# Patient Record
Sex: Female | Born: 1950 | Race: Black or African American | Hispanic: No | Marital: Married | Smoking: Never smoker
Health system: Southern US, Community
[De-identification: ages and names within clinical notes are randomized; demographics above are authoritative.]

## PROBLEM LIST (undated history)

## (undated) DIAGNOSIS — M1990 Unspecified osteoarthritis, unspecified site: Secondary | ICD-10-CM

## (undated) DIAGNOSIS — E785 Hyperlipidemia, unspecified: Secondary | ICD-10-CM

## (undated) DIAGNOSIS — I10 Essential (primary) hypertension: Secondary | ICD-10-CM

## (undated) DIAGNOSIS — R112 Nausea with vomiting, unspecified: Secondary | ICD-10-CM

## (undated) DIAGNOSIS — K219 Gastro-esophageal reflux disease without esophagitis: Secondary | ICD-10-CM

## (undated) DIAGNOSIS — Z9889 Other specified postprocedural states: Secondary | ICD-10-CM

## (undated) HISTORY — PX: BREAST SURGERY: SHX581

## (undated) HISTORY — PX: COLONOSCOPY: SHX174

---

## 2003-02-07 HISTORY — PX: OTHER SURGICAL HISTORY: SHX169

## 2011-02-07 HISTORY — PX: OTHER SURGICAL HISTORY: SHX169

## 2013-05-26 ENCOUNTER — Emergency Department (HOSPITAL_BASED_OUTPATIENT_CLINIC_OR_DEPARTMENT_OTHER)
Admission: EM | Admit: 2013-05-26 | Discharge: 2013-05-26 | Disposition: A | Discharge status: Home / Self Care | Payer: BC Managed Care – PPO | Attending: Emergency Medicine | Admitting: Emergency Medicine

## 2013-05-26 ENCOUNTER — Encounter (HOSPITAL_BASED_OUTPATIENT_CLINIC_OR_DEPARTMENT_OTHER): Payer: Self-pay | Admitting: Emergency Medicine

## 2013-05-26 DIAGNOSIS — E785 Hyperlipidemia, unspecified: Secondary | ICD-10-CM | POA: Insufficient documentation

## 2013-05-26 DIAGNOSIS — M545 Low back pain, unspecified: Secondary | ICD-10-CM

## 2013-05-26 DIAGNOSIS — Y9229 Other specified public building as the place of occurrence of the external cause: Secondary | ICD-10-CM | POA: Insufficient documentation

## 2013-05-26 DIAGNOSIS — W19XXXA Unspecified fall, initial encounter: Secondary | ICD-10-CM

## 2013-05-26 DIAGNOSIS — IMO0002 Reserved for concepts with insufficient information to code with codable children: Secondary | ICD-10-CM | POA: Insufficient documentation

## 2013-05-26 DIAGNOSIS — Z791 Long term (current) use of non-steroidal anti-inflammatories (NSAID): Secondary | ICD-10-CM | POA: Insufficient documentation

## 2013-05-26 DIAGNOSIS — W108XXA Fall (on) (from) other stairs and steps, initial encounter: Secondary | ICD-10-CM | POA: Insufficient documentation

## 2013-05-26 DIAGNOSIS — Y9301 Activity, walking, marching and hiking: Secondary | ICD-10-CM | POA: Insufficient documentation

## 2013-05-26 DIAGNOSIS — S5000XA Contusion of unspecified elbow, initial encounter: Secondary | ICD-10-CM | POA: Insufficient documentation

## 2013-05-26 DIAGNOSIS — Z79899 Other long term (current) drug therapy: Secondary | ICD-10-CM | POA: Insufficient documentation

## 2013-05-26 DIAGNOSIS — I10 Essential (primary) hypertension: Secondary | ICD-10-CM | POA: Insufficient documentation

## 2013-05-26 HISTORY — DX: Hyperlipidemia, unspecified: E78.5

## 2013-05-26 HISTORY — DX: Essential (primary) hypertension: I10

## 2013-05-26 MED ORDER — CYCLOBENZAPRINE HCL 10 MG PO TABS: 10.0000 mg | ORAL_TABLET | Freq: Three times a day (TID) | ORAL | Status: DC | PRN

## 2013-05-26 NOTE — Discharge Instructions (Signed)
Back Pain, Adult Low back pain is very common. About 1 in 5 people have back pain.The cause of low back pain is rarely dangerous. The pain often gets better over time.About half of people with a sudden onset of back pain feel better in just 2 weeks. About 8 in 10 people feel better by 6 weeks.  CAUSES Some common causes of back pain include:  Strain of the muscles or ligaments supporting the spine.  Wear and tear (degeneration) of the spinal discs.  Arthritis.  Direct injury to the back. DIAGNOSIS Most of the time, the direct cause of low back pain is not known.However, back pain can be treated effectively even when the exact cause of the pain is unknown.Answering your caregiver's questions about your overall health and symptoms is one of the most accurate ways to make sure the cause of your pain is not dangerous. If your caregiver needs more information, he or she may order lab work or imaging tests (X-rays or MRIs).However, even if imaging tests show changes in your back, this usually does not require surgery. HOME CARE INSTRUCTIONS For many people, back pain returns.Since low back pain is rarely dangerous, it is often a condition that people can learn to manageon their own.   Remain active. It is stressful on the back to sit or stand in one place. Do not sit, drive, or stand in one place for more than 30 minutes at a time. Take short walks on level surfaces as soon as pain allows.Try to increase the length of time you walk each day.  Do not stay in bed.Resting more than 1 or 2 days can delay your recovery.  Do not avoid exercise or work.Your body is made to move.It is not dangerous to be active, even though your back may hurt.Your back will likely heal faster if you return to being active before your pain is gone.  Pay attention to your body when you bend and lift. Many people have less discomfortwhen lifting if they bend their knees, keep the load close to their bodies,and  avoid twisting. Often, the most comfortable positions are those that put less stress on your recovering back.  Find a comfortable position to sleep. Use a firm mattress and lie on your side with your knees slightly bent. If you lie on your back, put a pillow under your knees.  Only take over-the-counter or prescription medicines as directed by your caregiver. Over-the-counter medicines to reduce pain and inflammation are often the most helpful.Your caregiver may prescribe muscle relaxant drugs.These medicines help dull your pain so you can more quickly return to your normal activities and healthy exercise.  Put ice on the injured area.  Put ice in a plastic bag.  Place a towel between your skin and the bag.  Leave the ice on for 15-20 minutes, 03-04 times a day for the first 2 to 3 days. After that, ice and heat may be alternated to reduce pain and spasms.  Ask your caregiver about trying back exercises and gentle massage. This may be of some benefit.  Avoid feeling anxious or stressed.Stress increases muscle tension and can worsen back pain.It is important to recognize when you are anxious or stressed and learn ways to manage it.Exercise is a great option. SEEK MEDICAL CARE IF:  You have pain that is not relieved with rest or medicine.  You have pain that does not improve in 1 week.  You have new symptoms.  You are generally not feeling well. SEEK   IMMEDIATE MEDICAL CARE IF:   You have pain that radiates from your back into your legs.  You develop new bowel or bladder control problems.  You have unusual weakness or numbness in your arms or legs.  You develop nausea or vomiting.  You develop abdominal pain.  You feel faint. Document Released: 01/23/2005 Document Revised: 07/25/2011 Document Reviewed: 06/13/2010 Exit[employer] Patient Information 2014 [employer].  

## 2013-05-26 NOTE — ED Provider Notes (Signed)
CSN: [id number]     Arrival date & time 05/26/13  [id number] History   First MD Initiated Contact with Patient 05/26/13 1005     Chief Complaint  Patient presents with  . Fall     (Consider location/radiation/quality/duration/timing/severity/associated sxs/prior Treatment) Patient is a 63 y.o. female presenting with fall.  Fall   Pt reports she stumbled while walking down carpeted stairs at church yesterday, falling down about 8 steps. She reports abrasions to knees, bruise to L elbow and pain in R lower back since then. The back pain is worsening and the main reason for coming to the ED. She describes it as an aching pain 'in hip' but points to her R lumbar paraspinal area. She is able to walk without difficulty but bending and stepping up into truck were painful this morning. Denies head injury or LOC.   Past Medical History  Diagnosis Date  . Hypertension   . Hyperlipemia    History reviewed. No pertinent past surgical history. No family history on file. History  Substance Use Topics  . Smoking status: Never Smoker   . Smokeless tobacco: Not on file  . Alcohol Use: No   OB History   Grav Para Term Preterm Abortions TAB SAB Ect Mult Living                 Review of Systems All other systems reviewed and are negative except as noted in HPI.     Allergies  Asa and Codeine  Home Medications   Prior to Admission medications   Medication Sig Start Date End Date Taking? Authorizing Provider  amLODipine (NORVASC) 10 MG tablet Take 10 mg by mouth daily.   Yes Historical Provider, MD  losartan-hydrochlorothiazide (HYZAAR) 100-25 MG per tablet Take 1 tablet by mouth daily.   Yes Historical Provider, MD  meloxicam (MOBIC) 15 MG tablet Take 7.5 mg by mouth 2 (two) times daily.   Yes Historical Provider, MD  metoprolol (LOPRESSOR) 100 MG tablet Take 100 mg by mouth 2 (two) times daily.   Yes Historical Provider, MD  rosuvastatin (CRESTOR) 40 MG tablet Take 40 mg by mouth daily.   Yes  Historical Provider, MD   BP 203/104  Pulse 75  Temp(Src) 97.8 F (36.6 C) (Oral)  Resp 18  Ht 5\' 5"  (1.651 m)  Wt 164 lb (74.39 kg)  BMI 27.29 kg/m2  SpO2 100% Physical Exam  Nursing note and vitals reviewed. Constitutional: She is oriented to person, place, and time. She appears well-developed and well-nourished.  HENT:  Head: Normocephalic and atraumatic.  Eyes: EOM are normal. Pupils are equal, round, and reactive to light.  Neck: Normal range of motion. Neck supple.  Cardiovascular: Normal rate, normal heart sounds and intact distal pulses.   Pulmonary/Chest: Effort normal and breath sounds normal.  Abdominal: Bowel sounds are normal. She exhibits no distension. There is no tenderness.  Musculoskeletal: Normal range of motion. She exhibits tenderness. She exhibits no edema.  Small contusion to medial L elbow, but FROM no, effusion, no bony tenderness; superficial abrasion both knees; moderate tenderness R lumbar paraspinal muscles, no bony tenderness in L-S spine or in R hip which has FROM  Neurological: She is alert and oriented to person, place, and time. She has normal strength. No cranial nerve deficit or sensory deficit.  Skin: Skin is warm and dry. No rash noted.  Psychiatric: She has a normal mood and affect.    ED Course  Procedures (including critical care time) Labs Review Labs  Reviewed - No data to display  Imaging Review No results found.   EKG Interpretation None      MDM   Final diagnoses:  Low back pain  Fall    Doubt any bony injury. She is taking Mobic for arthritis at home, does not do well with narcotic medications and has had good relief in the past with Flexeril. Advised not to drive or operate heavy machinery. PCP followup if not improving.     Yanelie Abraha B. Bernette MayersSheldon, MD 05/26/13 1018

## 2013-05-26 NOTE — ED Notes (Signed)
Pt fell yesterday,tripped on dress shoes and went down approx 8 steps. Pt hurting to right hip and left upper arm. Ambulatory on assessment.

## 2013-07-30 ENCOUNTER — Other Ambulatory Visit (HOSPITAL_COMMUNITY): Payer: Self-pay | Admitting: Orthopaedic Surgery

## 2013-07-31 ENCOUNTER — Other Ambulatory Visit (HOSPITAL_COMMUNITY): Payer: Self-pay | Admitting: Orthopaedic Surgery

## 2013-08-07 ENCOUNTER — Encounter (HOSPITAL_COMMUNITY): Payer: Self-pay | Admitting: Pharmacy Technician

## 2013-08-12 ENCOUNTER — Ambulatory Visit (HOSPITAL_COMMUNITY)
Admission: RE | Admit: 2013-08-12 | Discharge: 2013-08-12 | Disposition: A | Discharge status: Home / Self Care | Payer: BC Managed Care – PPO | Source: Ambulatory Visit | Attending: Orthopaedic Surgery | Admitting: Orthopaedic Surgery

## 2013-08-12 ENCOUNTER — Encounter (HOSPITAL_COMMUNITY): Payer: Self-pay

## 2013-08-12 ENCOUNTER — Encounter (HOSPITAL_COMMUNITY)
Admission: RE | Admit: 2013-08-12 | Discharge: 2013-08-12 | Disposition: A | Discharge status: Home / Self Care | Payer: BC Managed Care – PPO | Source: Ambulatory Visit | Attending: Orthopaedic Surgery | Admitting: Orthopaedic Surgery

## 2013-08-12 DIAGNOSIS — Q762 Congenital spondylolisthesis: Secondary | ICD-10-CM | POA: Insufficient documentation

## 2013-08-12 HISTORY — DX: Nausea with vomiting, unspecified: R11.2

## 2013-08-12 HISTORY — DX: Unspecified osteoarthritis, unspecified site: M19.90

## 2013-08-12 HISTORY — DX: Other specified postprocedural states: Z98.890

## 2013-08-12 HISTORY — DX: Gastro-esophageal reflux disease without esophagitis: K21.9

## 2013-08-12 LAB — CBC
HCT: 38.8 % (ref 36.0–46.0)
HEMOGLOBIN: 13 g/dL (ref 12.0–15.0)
MCH: 29.1 pg (ref 26.0–34.0)
MCHC: 33.5 g/dL (ref 30.0–36.0)
MCV: 87 fL (ref 78.0–100.0)
Platelets: 232 10*3/uL (ref 150–400)
RBC: 4.46 MIL/uL (ref 3.87–5.11)
RDW: 14 % (ref 11.5–15.5)
WBC: 7.7 10*3/uL (ref 4.0–10.5)

## 2013-08-12 LAB — URINE MICROSCOPIC-ADD ON

## 2013-08-12 LAB — PROTIME-INR
INR: 1.05 (ref 0.00–1.49)
Prothrombin Time: 13.7 seconds (ref 11.6–15.2)

## 2013-08-12 LAB — COMPREHENSIVE METABOLIC PANEL
ALBUMIN: 4 g/dL (ref 3.5–5.2)
ALK PHOS: 74 U/L (ref 39–117)
ALT: 16 U/L (ref 0–35)
ANION GAP: 16 — AB (ref 5–15)
AST: 20 U/L (ref 0–37)
BUN: 18 mg/dL (ref 6–23)
CO2: 23 mEq/L (ref 19–32)
Calcium: 9.8 mg/dL (ref 8.4–10.5)
Chloride: 102 mEq/L (ref 96–112)
Creatinine, Ser: 0.78 mg/dL (ref 0.50–1.10)
GFR calc Af Amer: >90 mL/min (ref >90)
GFR calc non Af Amer: 88 mL/min — ABNORMAL LOW (ref >90)
Glucose, Bld: 98 mg/dL (ref 70–99)
POTASSIUM: 3.9 meq/L (ref 3.7–5.3)
Sodium: 141 mEq/L (ref 137–147)
Total Bilirubin: <0.2 mg/dL — ABNORMAL LOW (ref 0.3–1.2)
Total Protein: 7.8 g/dL (ref 6.0–8.3)

## 2013-08-12 LAB — URINALYSIS, ROUTINE W REFLEX MICROSCOPIC
Bilirubin Urine: NEGATIVE
GLUCOSE, UA: NEGATIVE mg/dL
Hgb urine dipstick: NEGATIVE
Ketones, ur: NEGATIVE mg/dL
Nitrite: NEGATIVE
PROTEIN: NEGATIVE mg/dL
SPECIFIC GRAVITY, URINE: 1.015 (ref 1.005–1.030)
UROBILINOGEN UA: 0.2 mg/dL (ref 0.0–1.0)
pH: 6.5 (ref 5.0–8.0)

## 2013-08-12 LAB — SURGICAL PCR SCREEN
MRSA, PCR: NEGATIVE
Staphylococcus aureus: NEGATIVE

## 2013-08-12 LAB — TYPE AND SCREEN
ABO/RH(D): O POS
Antibody Screen: NEGATIVE

## 2013-08-12 LAB — ABO/RH: ABO/RH(D): O POS

## 2013-08-12 NOTE — Pre-Procedure Instructions (Signed)
Ignacia FellingLillie Simoni  08/12/2013   Your procedure is scheduled on:  Friday, July 17.  [facility] at 5:30AM.  Call this number if you have problems the morning of surgery: [phone]   Remember:   Do not eat food or drink liquids after midnight Thursday, July 16.   Take these medicines the morning of surgery with A SIP OF WATER:  nebivolol (BYSTOLIC).  May Use Flonase.              Stop taking Aspirin, Coumadin, Plavix, Effient and Herbal medications.  Do not take any NSAIDs ie: Ibuprofen,  Advil,Naproxen,meloxicam (MOBIC)   or any medication containing Aspirin.      Do not wear jewelry, make-up or nail polish.  Do not wear lotions, powders, or perfumes.   Do not shave 48 hours prior to surgery.  Do not bring valuables to the hospital.             [facility] is not responsible for any belongings or valuables.               Contacts, dentures or bridgework may not be worn into surgery.  Leave suitcase in the car. After surgery it may be brought to your room.  For patients admitted to the hospital, discharge time is determined by your treatment team.                Special Instructions: Review  [place] - Preparing For Surgery.   Please read over the following fact sheets that you were given: Pain Booklet, Coughing and Deep Breathing, Blood Transfusion Information and Surgical Site Infection Prevention And Incentive Spirometery.

## 2013-08-18 NOTE — Progress Notes (Signed)
Called Dr Tedra SenegalBland's office and re-requested EKG, labs, OV note.  Was told that we will not be able to get OV notes as they are not "signed off" but will send labs and EKG if available.

## 2013-08-20 NOTE — H&P (Signed)
  [facility]   A Division of [facility]   [address], [place], [place] [address] Telephone: [phone]  Fax: [phone]     PATIENT: Annette Jefferson, Annette Jefferson   MR#: [id number]  DOB: [birth year]      CHIEF COMPLAINT:  Back and left buttock pain  HPI: A 63 year old female returns with persistent problems with back pain, buttock pain left greater than right.  Her symptoms are worse in the morning. There are no bowel or bladder symptoms.  No neurogenic claudication symptoms.  No improvement with Medrol Dosepak.  She got some temporary improvement with trochanteric injections.   FAMILY HISTORY/SOCIAL HISTORY/14-POINT REVIEW OF SYSTEMS:  Reviewed and updated.  No changes as related to her history of present illness.  Family history positive for hypertension.  Social History:  She is married to her husband, Annette BeckersWalter.  She works as an Armed forces operational officerupholsterer and sewer.  She does not smoke or drink.  Review of systems positive for acid reflux, arthritis, hypertension.   MEDICATIONS:  Include Norvasc 10 mg daily, losartan/hydrochlorothiazide 100/25 mg p.o. daily, metoprolol 100 mg daily, meloxicam 7.5 mg daily, and Crestor daily.   ALLERGIES:  CODEINE, ASPIRIN, and TREE NUTS.   PAST SURGICAL HISTORY:  Includes foot surgery in 2013.   IMAGING:  MRI scan is reviewed with her from 07/17/2013.  This shows anterolisthesis of 4 mm with a circumferential pseudo disk bulge, severe facet degeneration with superimposed synovial cyst on the left with nerve compression that projects in the lateral recess.  This measures 10 x 4 mm transaxially.  There is superimposed ligamentum hypertrophy contributing to multifactorial stenosis at L4-5; 5-1 looks good.  Pars are intact.    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   PLAN:  I discussed with her that with stenosis she is not likely to get improvement with epidural steroid  injections.  She has had gradual progression now for 2 years.  She has had epidural injections at the beginning of this year [facility] without relief.  She is having greater difficulty with working and walking, increased pain, pain that bothers her at night.  We discussed with her the instability problem with facet cyst formation, anterolisthesis, foraminal and central stenosis, multifactorial.  She would require a single-level decompression and fusion, removal of the abnormal posterior elements with a Gill procedure, removal of extradural intraspinal facet cyst, pedicle instrumentation with cage on the left side insertion with bilateral intertransverse process fusion.  Use of Cell Saver, operative microscope, 2-night stay in the hospital, use of postoperative brace discussed.  She understands it will take 2 to 3 months for this to get solid.  Outlined plan discussed.  Risks of surgery discussed, including dural tear, reoperation, pseudarthrosis, pedicle screw malposition with nerve root problems, revision surgery, potential for years later adjacent-level problems.  Currently her adjacent levels look good.  All questions answered.  She understands and requests we proceed.         Mark C. Ophelia CharterYates, M.D.    Auto-Authenticated by Veverly FellsMark C. Ophelia CharterYates, M.D.

## 2013-08-21 MED ORDER — CEFAZOLIN SODIUM-DEXTROSE 2-3 GM-% IV SOLR
2.0000 g | INTRAVENOUS | Status: AC
  Administered 2013-08-22: 2 g via INTRAVENOUS
  Filled 2013-08-21: qty 50

## 2013-08-22 ENCOUNTER — Encounter (HOSPITAL_COMMUNITY)
Admission: RE | Disposition: A | Discharge status: Home / Self Care | Payer: BC Managed Care – PPO | Source: Ambulatory Visit | Attending: Orthopaedic Surgery

## 2013-08-22 ENCOUNTER — Encounter (HOSPITAL_COMMUNITY): Payer: Self-pay | Admitting: Surgery

## 2013-08-22 ENCOUNTER — Inpatient Hospital Stay (HOSPITAL_COMMUNITY): Payer: BC Managed Care – PPO

## 2013-08-22 ENCOUNTER — Inpatient Hospital Stay (HOSPITAL_COMMUNITY)
Admission: RE | Admit: 2013-08-22 | Discharge: 2013-08-25 | DRG: 460 | Disposition: A | Discharge status: Home / Self Care | Payer: BC Managed Care – PPO | Source: Ambulatory Visit | Attending: Orthopaedic Surgery | Admitting: Orthopaedic Surgery

## 2013-08-22 ENCOUNTER — Inpatient Hospital Stay (HOSPITAL_COMMUNITY): Payer: BC Managed Care – PPO | Admitting: Certified Registered Nurse Anesthetist

## 2013-08-22 ENCOUNTER — Encounter (HOSPITAL_COMMUNITY): Payer: BC Managed Care – PPO | Admitting: Certified Registered Nurse Anesthetist

## 2013-08-22 DIAGNOSIS — Z8249 Family history of ischemic heart disease and other diseases of the circulatory system: Secondary | ICD-10-CM

## 2013-08-22 DIAGNOSIS — Z91018 Allergy to other foods: Secondary | ICD-10-CM | POA: Diagnosis not present

## 2013-08-22 DIAGNOSIS — M5126 Other intervertebral disc displacement, lumbar region: Principal | ICD-10-CM | POA: Diagnosis present

## 2013-08-22 DIAGNOSIS — Q762 Congenital spondylolisthesis: Secondary | ICD-10-CM

## 2013-08-22 DIAGNOSIS — K219 Gastro-esophageal reflux disease without esophagitis: Secondary | ICD-10-CM | POA: Diagnosis present

## 2013-08-22 DIAGNOSIS — M7130 Other bursal cyst, unspecified site: Secondary | ICD-10-CM | POA: Diagnosis present

## 2013-08-22 DIAGNOSIS — I10 Essential (primary) hypertension: Secondary | ICD-10-CM | POA: Diagnosis present

## 2013-08-22 DIAGNOSIS — Z886 Allergy status to analgesic agent status: Secondary | ICD-10-CM | POA: Diagnosis not present

## 2013-08-22 DIAGNOSIS — Z888 Allergy status to other drugs, medicaments and biological substances status: Secondary | ICD-10-CM | POA: Diagnosis not present

## 2013-08-22 DIAGNOSIS — Z79899 Other long term (current) drug therapy: Secondary | ICD-10-CM

## 2013-08-22 DIAGNOSIS — G8918 Other acute postprocedural pain: Secondary | ICD-10-CM | POA: Diagnosis not present

## 2013-08-22 DIAGNOSIS — M48061 Spinal stenosis, lumbar region without neurogenic claudication: Secondary | ICD-10-CM | POA: Diagnosis present

## 2013-08-22 DIAGNOSIS — M549 Dorsalgia, unspecified: Secondary | ICD-10-CM | POA: Diagnosis present

## 2013-08-22 SURGERY — POSTERIOR LUMBAR FUSION 1 LEVEL
Anesthesia: General | Site: Back | Laterality: Left

## 2013-08-22 MED ORDER — PROPOFOL 10 MG/ML IV BOLUS
INTRAVENOUS | Status: DC | PRN
  Administered 2013-08-22: 200 mg via INTRAVENOUS

## 2013-08-22 MED ORDER — NEBIVOLOL HCL 10 MG PO TABS
10.0000 mg | ORAL_TABLET | Freq: Every day | ORAL | Status: DC
  Administered 2013-08-22 – 2013-08-24 (×3): 10 mg via ORAL
  Filled 2013-08-22 (×5): qty 1

## 2013-08-22 MED ORDER — NEOSTIGMINE METHYLSULFATE 10 MG/10ML IV SOLN
INTRAVENOUS | Status: DC | PRN
  Administered 2013-08-22: 4 mg via INTRAVENOUS

## 2013-08-22 MED ORDER — EPHEDRINE SULFATE 50 MG/ML IJ SOLN
INTRAMUSCULAR | Status: DC | PRN
  Administered 2013-08-22 (×3): 10 mg via INTRAVENOUS

## 2013-08-22 MED ORDER — STERILE WATER FOR INJECTION IJ SOLN
INTRAMUSCULAR | Status: AC
  Filled 2013-08-22: qty 20

## 2013-08-22 MED ORDER — THROMBIN 20000 UNITS EX SOLR
CUTANEOUS | Status: AC
  Filled 2013-08-22: qty 20000

## 2013-08-22 MED ORDER — SCOPOLAMINE 1 MG/3DAYS TD PT72
MEDICATED_PATCH | TRANSDERMAL | Status: AC
Start: 2013-08-22 — End: 2013-08-22
  Administered 2013-08-22: 1 via TRANSDERMAL
  Filled 2013-08-22: qty 1

## 2013-08-22 MED ORDER — CHLORTHALIDONE 25 MG PO TABS
12.5000 mg | ORAL_TABLET | Freq: Every day | ORAL | Status: DC
  Administered 2013-08-23 – 2013-08-25 (×3): 12.5 mg via ORAL
  Filled 2013-08-22 (×4): qty 0.5

## 2013-08-22 MED ORDER — AMLODIPINE-OLMESARTAN 10-40 MG PO TABS: 1.0000 | ORAL_TABLET | Freq: Every day | ORAL | Status: DC

## 2013-08-22 MED ORDER — ONDANSETRON HCL 4 MG/2ML IJ SOLN
INTRAMUSCULAR | Status: DC | PRN
  Administered 2013-08-22: 4 mg via INTRAVENOUS

## 2013-08-22 MED ORDER — METHOCARBAMOL 500 MG PO TABS
500.0000 mg | ORAL_TABLET | Freq: Four times a day (QID) | ORAL | Status: DC | PRN
Start: 2013-08-22 — End: 2013-08-25
  Administered 2013-08-24 (×2): 500 mg via ORAL
  Filled 2013-08-22 (×2): qty 1

## 2013-08-22 MED ORDER — MORPHINE SULFATE 2 MG/ML IJ SOLN
1.0000 mg | INTRAMUSCULAR | Status: DC | PRN
  Administered 2013-08-24: 1 mg via INTRAVENOUS
  Administered 2013-08-24: 2 mg via INTRAVENOUS
  Filled 2013-08-22 (×2): qty 1

## 2013-08-22 MED ORDER — AMLODIPINE BESYLATE 10 MG PO TABS
10.0000 mg | ORAL_TABLET | Freq: Every day | ORAL | Status: DC
  Administered 2013-08-23 – 2013-08-25 (×3): 10 mg via ORAL
  Filled 2013-08-22 (×4): qty 1

## 2013-08-22 MED ORDER — ACETAMINOPHEN 325 MG PO TABS: 650.0000 mg | ORAL_TABLET | ORAL | Status: DC | PRN

## 2013-08-22 MED ORDER — OXYCODONE HCL 5 MG/5ML PO SOLN: 5.0000 mg | Freq: Once | ORAL | Status: DC | PRN

## 2013-08-22 MED ORDER — POLYETHYLENE GLYCOL 3350 17 G PO PACK
17.0000 g | PACK | Freq: Every day | ORAL | Status: DC | PRN
  Administered 2013-08-23: 17 g via ORAL
  Filled 2013-08-22: qty 1

## 2013-08-22 MED ORDER — FLUTICASONE PROPIONATE 50 MCG/ACT NA SUSP
1.0000 | Freq: Every day | NASAL | Status: DC
  Administered 2013-08-23 – 2013-08-25 (×3): 1 via NASAL
  Filled 2013-08-22: qty 16

## 2013-08-22 MED ORDER — THROMBIN 20000 UNITS EX SOLR
OROMUCOSAL | Status: DC | PRN
  Administered 2013-08-22: 09:00:00 via TOPICAL

## 2013-08-22 MED ORDER — OLMESARTAN MEDOXOMIL 40 MG PO TABS
40.0000 mg | ORAL_TABLET | Freq: Every day | ORAL | Status: DC
  Administered 2013-08-23 – 2013-08-25 (×3): 40 mg via ORAL
  Filled 2013-08-22 (×4): qty 1

## 2013-08-22 MED ORDER — ROCURONIUM BROMIDE 100 MG/10ML IV SOLN
INTRAVENOUS | Status: DC | PRN
  Administered 2013-08-22: 50 mg via INTRAVENOUS

## 2013-08-22 MED ORDER — LACTATED RINGERS IV SOLN
INTRAVENOUS | Status: DC | PRN
  Administered 2013-08-22 (×3): via INTRAVENOUS

## 2013-08-22 MED ORDER — CEFAZOLIN SODIUM 1-5 GM-% IV SOLN
1.0000 g | Freq: Three times a day (TID) | INTRAVENOUS | Status: AC
  Administered 2013-08-22 (×2): 1 g via INTRAVENOUS
  Filled 2013-08-22 (×2): qty 50

## 2013-08-22 MED ORDER — ONDANSETRON HCL 4 MG/2ML IJ SOLN
INTRAMUSCULAR | Status: AC
  Filled 2013-08-22: qty 2

## 2013-08-22 MED ORDER — ROCURONIUM BROMIDE 50 MG/5ML IV SOLN
INTRAVENOUS | Status: AC
  Filled 2013-08-22: qty 1

## 2013-08-22 MED ORDER — SODIUM CHLORIDE 0.9 % IJ SOLN
3.0000 mL | Freq: Two times a day (BID) | INTRAMUSCULAR | Status: DC
  Administered 2013-08-23 – 2013-08-24 (×3): 3 mL via INTRAVENOUS

## 2013-08-22 MED ORDER — DEXAMETHASONE SODIUM PHOSPHATE 10 MG/ML IJ SOLN
INTRAMUSCULAR | Status: DC | PRN
  Administered 2013-08-22: 4 mg via INTRAVENOUS

## 2013-08-22 MED ORDER — MELOXICAM 15 MG PO TABS
15.0000 mg | ORAL_TABLET | Freq: Every day | ORAL | Status: DC
  Administered 2013-08-23 – 2013-08-25 (×3): 15 mg via ORAL
  Filled 2013-08-22 (×4): qty 1

## 2013-08-22 MED ORDER — ONDANSETRON HCL 4 MG/2ML IJ SOLN
4.0000 mg | Freq: Once | INTRAMUSCULAR | Status: AC | PRN
  Administered 2013-08-22: 4 mg via INTRAVENOUS

## 2013-08-22 MED ORDER — CALCIUM CARBONATE ANTACID 500 MG PO CHEW
1.0000 | CHEWABLE_TABLET | ORAL | Status: DC | PRN
  Filled 2013-08-22: qty 1

## 2013-08-22 MED ORDER — ACETAMINOPHEN 160 MG/5ML PO SOLN: 325.0000 mg | ORAL | Status: DC | PRN

## 2013-08-22 MED ORDER — THROMBIN 20000 UNITS EX KIT
PACK | CUTANEOUS | Status: AC
  Filled 2013-08-22: qty 1

## 2013-08-22 MED ORDER — GLYCOPYRROLATE 0.2 MG/ML IJ SOLN
INTRAMUSCULAR | Status: DC | PRN
  Administered 2013-08-22: .8 mg via INTRAVENOUS

## 2013-08-22 MED ORDER — SODIUM CHLORIDE 0.9 % IV SOLN: 250.0000 mL | INTRAVENOUS | Status: DC

## 2013-08-22 MED ORDER — PHENYLEPHRINE HCL 10 MG/ML IJ SOLN
INTRAMUSCULAR | Status: AC
Start: 2013-08-22 — End: 2013-08-22
  Filled 2013-08-22: qty 1

## 2013-08-22 MED ORDER — METHOCARBAMOL 500 MG PO TABS: 500.0000 mg | ORAL_TABLET | Freq: Four times a day (QID) | ORAL | Status: DC | PRN

## 2013-08-22 MED ORDER — SODIUM CHLORIDE 0.9 % IJ SOLN
3.0000 mL | INTRAMUSCULAR | Status: DC | PRN
  Administered 2013-08-24 (×2): 3 mL via INTRAVENOUS

## 2013-08-22 MED ORDER — MENTHOL 3 MG MT LOZG: 1.0000 | LOZENGE | OROMUCOSAL | Status: DC | PRN

## 2013-08-22 MED ORDER — ARTIFICIAL TEARS OP OINT
TOPICAL_OINTMENT | OPHTHALMIC | Status: AC
  Filled 2013-08-22: qty 3.5

## 2013-08-22 MED ORDER — ACETAMINOPHEN 325 MG PO TABS: 325.0000 mg | ORAL_TABLET | ORAL | Status: DC | PRN

## 2013-08-22 MED ORDER — OXYCODONE-ACETAMINOPHEN 5-325 MG PO TABS: 1.0000 | ORAL_TABLET | ORAL | Status: DC | PRN

## 2013-08-22 MED ORDER — ONDANSETRON HCL 4 MG/2ML IJ SOLN
4.0000 mg | INTRAMUSCULAR | Status: DC | PRN
  Administered 2013-08-22 – 2013-08-24 (×3): 4 mg via INTRAVENOUS
  Filled 2013-08-22 (×3): qty 2

## 2013-08-22 MED ORDER — OXYCODONE-ACETAMINOPHEN 5-325 MG PO TABS
1.0000 | ORAL_TABLET | ORAL | Status: DC | PRN
Start: 2013-08-22 — End: 2019-06-02

## 2013-08-22 MED ORDER — GLYCOPYRROLATE 0.2 MG/ML IJ SOLN
INTRAMUSCULAR | Status: AC
  Filled 2013-08-22: qty 2

## 2013-08-22 MED ORDER — SODIUM CHLORIDE 0.9 % IV SOLN
10.0000 mg | INTRAVENOUS | Status: DC | PRN
  Administered 2013-08-22: 20 ug/min via INTRAVENOUS

## 2013-08-22 MED ORDER — SCOPOLAMINE 1 MG/3DAYS TD PT72
MEDICATED_PATCH | TRANSDERMAL | Status: DC | PRN
  Administered 2013-08-22: 1 via TRANSDERMAL

## 2013-08-22 MED ORDER — ZOLPIDEM TARTRATE 5 MG PO TABS: 5.0000 mg | ORAL_TABLET | Freq: Every evening | ORAL | Status: DC | PRN

## 2013-08-22 MED ORDER — FLEET ENEMA 7-19 GM/118ML RE ENEM: 1.0000 | ENEMA | Freq: Once | RECTAL | Status: AC | PRN

## 2013-08-22 MED ORDER — HYDROCODONE-ACETAMINOPHEN 5-325 MG PO TABS
1.0000 | ORAL_TABLET | ORAL | Status: DC | PRN
  Administered 2013-08-23 – 2013-08-24 (×3): 2 via ORAL
  Filled 2013-08-22 (×4): qty 2

## 2013-08-22 MED ORDER — ACETAMINOPHEN 650 MG RE SUPP: 650.0000 mg | RECTAL | Status: DC | PRN

## 2013-08-22 MED ORDER — NEOSTIGMINE METHYLSULFATE 10 MG/10ML IV SOLN
INTRAVENOUS | Status: AC
  Filled 2013-08-22: qty 1

## 2013-08-22 MED ORDER — HYDROMORPHONE HCL PF 1 MG/ML IJ SOLN
INTRAMUSCULAR | Status: AC
  Filled 2013-08-22: qty 1

## 2013-08-22 MED ORDER — PROPOFOL 10 MG/ML IV BOLUS
INTRAVENOUS | Status: AC
  Filled 2013-08-22: qty 20

## 2013-08-22 MED ORDER — FENTANYL CITRATE 0.05 MG/ML IJ SOLN
INTRAMUSCULAR | Status: AC
  Filled 2013-08-22: qty 5

## 2013-08-22 MED ORDER — MIDAZOLAM HCL 5 MG/5ML IJ SOLN
INTRAMUSCULAR | Status: DC | PRN
  Administered 2013-08-22: 2 mg via INTRAVENOUS

## 2013-08-22 MED ORDER — DOCUSATE SODIUM 100 MG PO CAPS
100.0000 mg | ORAL_CAPSULE | Freq: Two times a day (BID) | ORAL | Status: DC
  Administered 2013-08-22 – 2013-08-25 (×7): 100 mg via ORAL
  Filled 2013-08-22 (×8): qty 1

## 2013-08-22 MED ORDER — KCL IN DEXTROSE-NACL 20-5-0.45 MEQ/L-%-% IV SOLN
INTRAVENOUS | Status: DC
  Filled 2013-08-22 (×8): qty 1000

## 2013-08-22 MED ORDER — ATORVASTATIN CALCIUM 80 MG PO TABS
80.0000 mg | ORAL_TABLET | Freq: Every day | ORAL | Status: DC
  Administered 2013-08-23 – 2013-08-24 (×2): 80 mg via ORAL
  Filled 2013-08-22 (×4): qty 1

## 2013-08-22 MED ORDER — HYDROMORPHONE HCL PF 1 MG/ML IJ SOLN
0.2500 mg | INTRAMUSCULAR | Status: DC | PRN
  Administered 2013-08-22: 0.5 mg via INTRAVENOUS

## 2013-08-22 MED ORDER — METHOCARBAMOL 1000 MG/10ML IJ SOLN
500.0000 mg | Freq: Four times a day (QID) | INTRAVENOUS | Status: DC | PRN
  Filled 2013-08-22: qty 5

## 2013-08-22 MED ORDER — LIDOCAINE HCL (CARDIAC) 20 MG/ML IV SOLN
INTRAVENOUS | Status: AC
  Filled 2013-08-22: qty 5

## 2013-08-22 MED ORDER — EPHEDRINE SULFATE 50 MG/ML IJ SOLN
INTRAMUSCULAR | Status: AC
  Filled 2013-08-22: qty 2

## 2013-08-22 MED ORDER — MIDAZOLAM HCL 2 MG/2ML IJ SOLN
INTRAMUSCULAR | Status: AC
Start: 2013-08-22 — End: 2013-08-22
  Filled 2013-08-22: qty 2

## 2013-08-22 MED ORDER — FENTANYL CITRATE 0.05 MG/ML IJ SOLN
INTRAMUSCULAR | Status: DC | PRN
  Administered 2013-08-22: 100 ug via INTRAVENOUS
  Administered 2013-08-22: 25 ug via INTRAVENOUS
  Administered 2013-08-22 (×2): 50 ug via INTRAVENOUS
  Administered 2013-08-22: 25 ug via INTRAVENOUS

## 2013-08-22 MED ORDER — PANTOPRAZOLE SODIUM 40 MG IV SOLR
40.0000 mg | Freq: Every day | INTRAVENOUS | Status: DC
  Administered 2013-08-22: 40 mg via INTRAVENOUS
  Filled 2013-08-22 (×2): qty 40

## 2013-08-22 MED ORDER — OXYCODONE HCL 5 MG PO TABS: 5.0000 mg | ORAL_TABLET | Freq: Once | ORAL | Status: DC | PRN

## 2013-08-22 MED ORDER — ARTIFICIAL TEARS OP OINT
TOPICAL_OINTMENT | OPHTHALMIC | Status: DC | PRN
  Administered 2013-08-22: 1 via OPHTHALMIC

## 2013-08-22 MED ORDER — KETOROLAC TROMETHAMINE 30 MG/ML IJ SOLN
30.0000 mg | Freq: Four times a day (QID) | INTRAMUSCULAR | Status: AC
  Administered 2013-08-22 – 2013-08-23 (×3): 30 mg via INTRAVENOUS
  Filled 2013-08-22 (×3): qty 1

## 2013-08-22 MED ORDER — PHENOL 1.4 % MT LIQD: 1.0000 | OROMUCOSAL | Status: DC | PRN

## 2013-08-22 MED ORDER — LIDOCAINE HCL (CARDIAC) 20 MG/ML IV SOLN
INTRAVENOUS | Status: DC | PRN
  Administered 2013-08-22: 80 mg via INTRAVENOUS

## 2013-08-22 MED ORDER — BISACODYL 10 MG RE SUPP: 10.0000 mg | Freq: Every day | RECTAL | Status: DC | PRN

## 2013-08-22 SURGICAL SUPPLY — 65 items
BLADE SURG ROTATE 9660 (MISCELLANEOUS) IMPLANT
BUR ROUND FLUTED 4 SOFT TCH (BURR) ×2 IMPLANT
CAP SPINAL LOCKING TI (Cap) ×8 IMPLANT
CORDS BIPOLAR (ELECTRODE) ×2 IMPLANT
COVER SURGICAL LIGHT HANDLE (MISCELLANEOUS) ×2 IMPLANT
DERMABOND ADHESIVE PROPEN (GAUZE/BANDAGES/DRESSINGS) ×1
DERMABOND ADVANCED (GAUZE/BANDAGES/DRESSINGS) ×1
DERMABOND ADVANCED .7 DNX12 (GAUZE/BANDAGES/DRESSINGS) ×1 IMPLANT
DERMABOND ADVANCED .7 DNX6 (GAUZE/BANDAGES/DRESSINGS) ×1 IMPLANT
DRAPE C-ARM 42X72 X-RAY (DRAPES) ×2 IMPLANT
DRAPE MICROSCOPE LEICA (MISCELLANEOUS) ×2 IMPLANT
DRAPE SURG 17X23 STRL (DRAPES) ×6 IMPLANT
DRAPE TABLE COVER HEAVY DUTY (DRAPES) ×2 IMPLANT
DRSG EMULSION OIL 3X3 NADH (GAUZE/BANDAGES/DRESSINGS) ×2 IMPLANT
DRSG MEPILEX BORDER 4X4 (GAUZE/BANDAGES/DRESSINGS) ×2 IMPLANT
DRSG MEPILEX BORDER 4X8 (GAUZE/BANDAGES/DRESSINGS) ×2 IMPLANT
DRSG PAD ABDOMINAL 8X10 ST (GAUZE/BANDAGES/DRESSINGS) ×4 IMPLANT
DURAPREP 26ML APPLICATOR (WOUND CARE) ×2 IMPLANT
ELECT BLADE 4.0 EZ CLEAN MEGAD (MISCELLANEOUS) ×2
ELECT CAUTERY BLADE 6.4 (BLADE) ×2 IMPLANT
ELECT REM PT RETURN 9FT ADLT (ELECTROSURGICAL) ×2
ELECTRODE BLDE 4.0 EZ CLN MEGD (MISCELLANEOUS) ×1 IMPLANT
ELECTRODE REM PT RTRN 9FT ADLT (ELECTROSURGICAL) ×1 IMPLANT
EVACUATOR 1/8 PVC DRAIN (DRAIN) ×2 IMPLANT
GLOVE BIOGEL PI IND STRL 7.5 (GLOVE) ×1 IMPLANT
GLOVE BIOGEL PI IND STRL 8 (GLOVE) ×1 IMPLANT
GLOVE BIOGEL PI INDICATOR 7.5 (GLOVE) ×1
GLOVE BIOGEL PI INDICATOR 8 (GLOVE) ×1
GLOVE ECLIPSE 7.0 STRL STRAW (GLOVE) ×2 IMPLANT
GLOVE ORTHO TXT STRL SZ7.5 (GLOVE) ×2 IMPLANT
GOWN STRL REUS W/ TWL LRG LVL3 (GOWN DISPOSABLE) ×3 IMPLANT
GOWN STRL REUS W/TWL LRG LVL3 (GOWN DISPOSABLE) ×3
HEMOSTAT SURGICEL 2X14 (HEMOSTASIS) IMPLANT
KIT BASIN OR (CUSTOM PROCEDURE TRAY) ×2 IMPLANT
KIT POSITION SURG JACKSON T1 (MISCELLANEOUS) ×2 IMPLANT
KIT ROOM TURNOVER OR (KITS) ×2 IMPLANT
MANIFOLD NEPTUNE II (INSTRUMENTS) ×2 IMPLANT
NDL SUT .5 MAYO 1.404X.05X (NEEDLE) ×1 IMPLANT
NEEDLE MAYO TAPER (NEEDLE) ×1
NS IRRIG 1000ML POUR BTL (IV SOLUTION) ×2 IMPLANT
PACK LAMINECTOMY ORTHO (CUSTOM PROCEDURE TRAY) ×2 IMPLANT
PAD ARMBOARD 7.5X6 YLW CONV (MISCELLANEOUS) ×4 IMPLANT
PATTIES SURGICAL .5 X.5 (GAUZE/BANDAGES/DRESSINGS) ×2 IMPLANT
PATTIES SURGICAL .75X.75 (GAUZE/BANDAGES/DRESSINGS) ×2 IMPLANT
ROD 45MM (Rod) ×2 IMPLANT
ROD SPNL CVD 45X5.5XHRD NS (Rod) ×2 IMPLANT
SCREW MATRIX MIS 6.0X40MM (Screw) ×6 IMPLANT
SCREW MATRIX MIS 6.0X45MM (Screw) ×2 IMPLANT
SPACER CONCORDE CUR 5DEG L10MM (Orthopedic Implant) ×2 IMPLANT
SPONGE LAP 18X18 X RAY DECT (DISPOSABLE) ×2 IMPLANT
SPONGE LAP 4X18 X RAY DECT (DISPOSABLE) ×4 IMPLANT
SPONGE SURGIFOAM ABS GEL 100 (HEMOSTASIS) ×2 IMPLANT
STAPLER VISISTAT 35W (STAPLE) IMPLANT
SURGIFLO TRUKIT (HEMOSTASIS) ×2 IMPLANT
SUT BONE WAX W31G (SUTURE) ×2 IMPLANT
SUT VIC AB 2-0 CT1 27 (SUTURE) ×1
SUT VIC AB 2-0 CT1 TAPERPNT 27 (SUTURE) ×1 IMPLANT
SUT VICRYL 0 TIES 12 18 (SUTURE) ×2 IMPLANT
SUT VICRYL 4-0 PS2 18IN ABS (SUTURE) IMPLANT
SUT VICRYL AB 2 0 TIES (SUTURE) ×2 IMPLANT
TOWEL OR 17X24 6PK STRL BLUE (TOWEL DISPOSABLE) ×2 IMPLANT
TOWEL OR 17X26 10 PK STRL BLUE (TOWEL DISPOSABLE) ×2 IMPLANT
TRAY FOLEY CATH 16FRSI W/METER (SET/KITS/TRAYS/PACK) ×2 IMPLANT
WATER STERILE IRR 1000ML POUR (IV SOLUTION) IMPLANT
YANKAUER SUCT BULB TIP NO VENT (SUCTIONS) ×2 IMPLANT

## 2013-08-22 NOTE — Interval H&P Note (Signed)
History and Physical Interval Note:  08/22/2013 7:17 AM  Annette Jefferson  has presented today for surgery, with the diagnosis of L4-5 Spondylolisthesis, Instability, Lateral Recess Stenosis  The various methods of treatment have been discussed with the patient and family. After consideration of risks, benefits and other options for treatment, the patient has consented to  Procedure(s) with comments: POSTERIOR LUMBAR FUSION 1 LEVEL (Left) - Left L4-5 TLIF, Gill Procedure, Excision Extradural Intrapinal Cyst, Cage, Pedicle Instrumentation, Bilateral Fusion as a surgical intervention .  The patient's history has been reviewed, patient examined, no change in status, stable for surgery.  I have reviewed the patient's chart and labs.  Questions were answered to the patient's satisfaction.     [name] C

## 2013-08-22 NOTE — Anesthesia Procedure Notes (Signed)
Procedure Name: Intubation Date/Time: 08/22/2013 7:46 AM Performed by: Reine JustFLOWERS, Aminah Zabawa T Pre-anesthesia Checklist: Patient identified, Emergency Drugs available, Suction available, Patient being monitored and Timeout performed Patient Re-evaluated:Patient Re-evaluated prior to inductionOxygen Delivery Method: Circle system utilized and Simple face mask Preoxygenation: Pre-oxygenation with 100% oxygen Intubation Type: IV induction Ventilation: Mask ventilation without difficulty Laryngoscope Size: Miller and 3 Grade View: Grade II Tube type: Oral Tube size: 7.5 mm Number of attempts: 1 Airway Equipment and Method: Patient positioned with wedge pillow and Stylet Placement Confirmation: ETT inserted through vocal cords under direct vision,  positive ETCO2 and breath sounds checked- equal and bilateral Secured at: 22 cm Tube secured with: Tape Dental Injury: Teeth and Oropharynx as per pre-operative assessment  Comments: Attempt made per EMT student with mac 4. Unable to visualize. No attempts made. OETT placed by CRNA as documented above

## 2013-08-22 NOTE — Progress Notes (Signed)
Utilization review completed.  

## 2013-08-22 NOTE — Anesthesia Preprocedure Evaluation (Addendum)
Anesthesia Evaluation  Patient identified by MRN, date of birth, ID band Patient awake    Reviewed: Allergy & Precautions, H&P , NPO status , Patient's Chart, lab work & pertinent test results, reviewed documented beta blocker date and time   History of Anesthesia Complications (+) PONV and history of anesthetic complications  Airway Mallampati: II TM Distance: >3 FB Neck ROM: Full    Dental  (+) Teeth Intact, Dental Advisory Given   Pulmonary neg pulmonary ROS,  breath sounds clear to auscultation        Cardiovascular hypertension, Pt. on medications and Pt. on home beta blockers - angina- CAD, - Past MI, - CHF and - DOE - dysrhythmias Rhythm:Regular     Neuro/Psych Back pain with pain radiating down right side  Neuromuscular disease negative psych ROS   GI/Hepatic Neg liver ROS, GERD-  Medicated and Controlled,  Endo/Other  negative endocrine ROS  Renal/GU negative Renal ROS     Musculoskeletal negative musculoskeletal ROS (+)   Abdominal   Peds  Hematology negative hematology ROS (+)   Anesthesia Other Findings   Reproductive/Obstetrics                          Anesthesia Physical Anesthesia Plan  ASA: II  Anesthesia Plan: General   Post-op Pain Management:    Induction: Intravenous  Airway Management Planned: Oral ETT  Additional Equipment: None  Intra-op Plan:   Post-operative Plan: Extubation in OR  Informed Consent: I have reviewed the patients History and Physical, chart, labs and discussed the procedure including the risks, benefits and alternatives for the proposed anesthesia with the patient or authorized representative who has indicated his/her understanding and acceptance.   Dental advisory given  Plan Discussed with: CRNA and Surgeon  Anesthesia Plan Comments:         Anesthesia Quick Evaluation

## 2013-08-22 NOTE — Op Note (Addendum)
Annette Jefferson, Annette Jefferson             ACCOUNT NO.:  [id number]  MEDICAL RECORD NO.:  [id number]  LOCATION:  [facility]                         FACILITY:  [facility]  PHYSICIAN:  Alani Sabbagh C. Ophelia Charter, M.D.    DATE OF BIRTH:  1950-11-01  DATE OF PROCEDURE:  08/22/2013 DATE OF DISCHARGE:                              OPERATIVE REPORT   PREOPERATIVE DIAGNOSES:  L4-5 spondylolisthesis, instability, intraspinal extradural facet cyst, radiculopathy.  L4-5 disk protrusion, central , lateral recess and foraminal stenosis.   POSTOPERATIVE DIAGNOSES:  L4-5 spondylolisthesis, instability, intraspinal extradural facet cyst, radiculopathy. L4-5 disk protrusion, central, foraminal and lateral recess stenosis  PROCEDURE:  Excision of extradural intraspinal facet cyst left L4-5 TLIF, Gill procedure, removal of abnormal posterior elements.  Pedicle instrumentation and bilateral fusion with local bone.  INSTRUMENTATION:  [place] curved DePuy cage 10 mm.  Matrix Synthes pedicle instruments.  6-mm screws x40 or 45 mm.  (left L4-5 screw 45 mm, all others 40 mm.  45 mm rods x2).  SURGEON:  Annell Greening, MD  ANESTHESIA:  General plus Marcaine local.  ASSISTANT:  Maud Deed, PA-C, medically necessary and present for the entire procedure.  EBL:  150 mL.  DRAINS:  None.  COMPLICATIONS:  None.  DESCRIPTION OF PROCEDURE:  After induction of general anesthesia, the patient was placed on the spine frame, carefully padded and positioned, 2 g Ancef prophylaxis.  Back was prepped with DuraPrep.  Area was squared with towels.  Betadine, Steri-Drape applied and laminectomy sheets and drapes.  Time-out procedure completed.  C-arm was used for localization of the appropriate level.  Based on palpable landmarks incision was made midline, subperiosteal dissection on the lamina. Anterolisthesis L4-5 level was identified as well as the sacrum.  Facets were uncovered and transverse process at L4 and L5 on both the right and left  were uncovered.  Kocher clamps were placed for planned levels of decompression.  We were able to go down to the posterior elements and decompression procedure, C-arm confirmed this.  Bone was marked with a skin marker after confirmation with x-rays and then posterior decompression was performed removing abnormal posterior elements taking the facet cyst out on the left side.  Bone was removed out to the level of the pedicle and the extradural facet cyst.  Operative microscope was used for removal of the cyst which was in the lateral recess and extending cephalad causing compression on the L4 nerve root as well.  L4 nerve root was decompressed.  Disk was bulging.  Decompression laterally out to the level of the pedicles on the opposite side with foraminotomy. Annulus was incised on the left, and instruments were used using straight pituitaries, the scrapers progressing up to 10 mm, angled curettes, straight curettes, angled rasps, straight rasp, straight and up-biting small and large __________ pituitaries.  Once diskectomy was performed, the anterior anulus was intact.  Trial sizers were placed and the C-arm was used and 10 mm gave a nice tight fit, restored the height, and with tensioning of the ligaments reduced the degenerative anterolisthesis.  Dura was intact.  Both nerve roots were free both right and left.  Irrigation and packing of the disk space with decorticated bone from removal of  the facets posterior elements.  Bone was packed in the cage.  Cage was inserted partially, checked under fluoroscopy and then continued to be advanced until it was kicked over to the midline and was countersunk several millimeters.  Cage was tight and would not advance further and was in good position AP and lateral. Pedicle screws were then inserted.  With all starter checked under fluoro using the hand all progressing down the pedicle checking under fluoro, feeling with the pedicle feeler, tapping,  using the feeler again, decortication of the lateral transverse process with the bur, insertion of the screw, checking under fluoroscopy and then repeating at the other levels.  All were 6 mm screws, all were 40 mm in length except on the left at L4 45-mm screw was placed.  On AP, there was good convergence.  Screws gave good bite.  Palpation with hockey stick up to the foramina showed good position, and in this patient's body habitus, the inferior aspect of pedicle was well visualized and screw was several millimeters above that on direct lateral.  Rods were inserted.  TLIF side was compressed first and then the opposite side compressing.  All caps were locked down.  There was 1-2 mm of rod sticking on each end of the screw.  After irrigation final spot pictures were taken for documentation.  Standard closure #1 Vicryl, 2-0 Vicryl, 3-0 Vicryl subcuticular closure.  Dermabond on the skin, postop dressing, and transferred to the recovery room in stable condition.  Instrument count and needle count was correct.  Thrombin-soaked Gelfoam was used and a bipolar cautery was used for epidural veins throughout the case. Operative field was dry at the time of closure.  There was inadequate blood to re-transfuse using the Cell Saver but had been used during the case, but there was less bleeding than normal.  The patient tolerated the procedure well.     Annette Jefferson, M.D.     MCY/MEDQ  D:  08/22/2013  T:  08/22/2013  Job:  [id number]

## 2013-08-22 NOTE — Discharge Instructions (Signed)
°  Call if there is increasing drainage, fever greater than 101.5, severe head aches, and worsening nausea or light sensitivity. If shortness of breath, bloody cough or chest tightness or pain go to an emergency room. No lifting greater than 10 lbs. Avoid bending, stooping and twisting. Use brace when sitting and out of bed even to go to bathroom. Walk in house for first 2 weeks then may start to get out slowly increasing distances up to one mile by 4-6 weeks post op. After 5 days may shower and change dressing following bathing with shower.When bathing remove the brace shower and replace brace before getting out of the shower. If drainage, keep dry dressing and do not bathe the incision, use an moisture impervious dressing. Please call and return for scheduled follow up appointment 2 weeks from the time of surgery. Ice packs to back daily as needed for pain and swelling

## 2013-08-22 NOTE — Brief Op Note (Cosign Needed)
08/22/2013  11:12 AM  PATIENT:  Annette Jefferson  63 y.o. female  PRE-OPERATIVE DIAGNOSIS:  L4-5 Spondylolisthesis, Instability, Lateral Recess Stenosis  POST-OPERATIVE DIAGNOSIS:  L4-5 Spondylolisthesis, Instability, Lateral Recess Stenosis  PROCEDURE:  Procedure(s) with comments: POSTERIOR LUMBAR FUSION 1 LEVEL (Left) - Left L4-5 TLIF, Gill Procedure, Excision Extradural Intraspinal Cyst, Cage, Pedicle Instrumentation, Bilateral Fusion  SURGEON:  Surgeon(s) and Role:    * Eldred MangesMark C Yates, MD - Primary  PHYSICIAN ASSISTANT: Maud DeedSheila Shaylie Eklund [facility]  ASSISTANTS: none   ANESTHESIA:   general  EBL:  Total I/O In: 2000 [I.V.:2000] Out: 300 [Urine:100; Blood:200]  BLOOD ADMINISTERED:none  DRAINS: none   LOCAL MEDICATIONS USED:  NONE  SPECIMEN:  No Specimen  DISPOSITION OF SPECIMEN:  N/A  COUNTS:  YES  TOURNIQUET:  * No tourniquets in log *  DICTATION: .Note written in EPIC  PLAN OF CARE: Admit to inpatient   PATIENT DISPOSITION:  PACU - hemodynamically stable.   Delay start of Pharmacological VTE agent (>24hrs) due to surgical blood loss or risk of bleeding: yes

## 2013-08-22 NOTE — Anesthesia Postprocedure Evaluation (Signed)
  Anesthesia Post-op Note  Patient: Ignacia FellingLillie Calamari  Procedure(s) Performed: Procedure(s) with comments: POSTERIOR LUMBAR FUSION 1 LEVEL (Left) - Left L4-5 TLIF, Gill Procedure, Excision Extradural Intraspinal Cyst, Cage, Pedicle Instrumentation, Bilateral Fusion  Patient Location: PACU  Anesthesia Type:General  Level of Consciousness: awake and alert   Airway and Oxygen Therapy: Patient Spontanous Breathing  Post-op Pain: moderate  Post-op Assessment: Post-op Vital signs reviewed, Patient's Cardiovascular Status Stable, Respiratory Function Stable, Patent Airway, No signs of Nausea or vomiting and Pain level controlled  Post-op Vital Signs: Reviewed and stable  Last Vitals:  Filed Vitals:   08/22/13 1200  BP: 113/50  Pulse: 61  Temp:   Resp: 14    Complications: No apparent anesthesia complications

## 2013-08-22 NOTE — H&P (Signed)
  Physical Exam for pre op history and physical:  HEENT:  Normocephalic, atraumatic,  PERRLA, EOMI , nose without drainage, oropharnyx clear. HEART:  Regular rate and rhythm LUNGS:  Clear to auscultation ABD:  SOFT AND NON TENDER EXTREMITIES:  No focal weakness of LEs.  Mildly positive SLR bilateral in the supine position. Distal pulses and sensation intact.

## 2013-08-22 NOTE — Transfer of Care (Signed)
Immediate Anesthesia Transfer of Care Note  Patient: Annette Jefferson  Procedure(s) Performed: Procedure(s) with comments: POSTERIOR LUMBAR FUSION 1 LEVEL (Left) - Left L4-5 TLIF, Gill Procedure, Excision Extradural Intraspinal Cyst, Cage, Pedicle Instrumentation, Bilateral Fusion  Patient Location: PACU  Anesthesia Type:General  Level of Consciousness: awake, alert  and oriented  Airway & Oxygen Therapy: Patient Spontanous Breathing and Patient connected to nasal cannula oxygen  Post-op Assessment: Report given to PACU RN, Post -op Vital signs reviewed and stable and Patient moving all extremities X 4  Post vital signs: Reviewed and stable  Complications: No apparent anesthesia complications

## 2013-08-23 LAB — BASIC METABOLIC PANEL
ANION GAP: 13 (ref 5–15)
BUN: 17 mg/dL (ref 6–23)
CHLORIDE: 104 meq/L (ref 96–112)
CO2: 26 mEq/L (ref 19–32)
CREATININE: 0.78 mg/dL (ref 0.50–1.10)
Calcium: 8.5 mg/dL (ref 8.4–10.5)
GFR calc Af Amer: >90 mL/min (ref >90)
GFR calc non Af Amer: 88 mL/min — ABNORMAL LOW (ref >90)
Glucose, Bld: 130 mg/dL — ABNORMAL HIGH (ref 70–99)
POTASSIUM: 3.3 meq/L — AB (ref 3.7–5.3)
Sodium: 143 mEq/L (ref 137–147)

## 2013-08-23 LAB — CBC
HCT: 31.4 % — ABNORMAL LOW (ref 36.0–46.0)
Hemoglobin: 10.4 g/dL — ABNORMAL LOW (ref 12.0–15.0)
MCH: 28.8 pg (ref 26.0–34.0)
MCHC: 33.1 g/dL (ref 30.0–36.0)
MCV: 87 fL (ref 78.0–100.0)
PLATELETS: 170 10*3/uL (ref 150–400)
RBC: 3.61 MIL/uL — ABNORMAL LOW (ref 3.87–5.11)
RDW: 14.1 % (ref 11.5–15.5)
WBC: 12.2 10*3/uL — AB (ref 4.0–10.5)

## 2013-08-23 MED ORDER — PANTOPRAZOLE SODIUM 40 MG PO TBEC
40.0000 mg | DELAYED_RELEASE_TABLET | Freq: Every day | ORAL | Status: DC
  Administered 2013-08-23 – 2013-08-24 (×2): 40 mg via ORAL
  Filled 2013-08-23: qty 1

## 2013-08-23 NOTE — Progress Notes (Signed)
Physical Therapy Evaluation Patient Details Name: Annette Jefferson MRN: [id number] DOB: [birth year] Today's Date: 08/23/2013   History of Present Illness  63 yo female admitted for spinal stenosis of lumbar region with s/p posterior lumbar fusion 1 level L4-5  Clinical Impression  Patient presents with acute surgical pain at tolerable level.  Back brace is on and in place, encouraged position changes periodically and staff assist for ambulation as tolerated.  Instruction in Log Roll technique for bed mobility with Min assist, ambulating with Supervision and walker.  Patient will benefit from continued PT to assess stair climbing ability, bed mobility and transfer training.  Anticipate ability to return to prior home with assistance in 1-2 days.    Follow Up Recommendations Home health PT;Supervision/Assistance - 24 hour    Equipment Recommendations  Rolling walker with 5" wheels    Recommendations for Other Services       Precautions / Restrictions Precautions Precautions: Back Precaution Booklet Issued: Yes (comment) Precaution Comments: reviewed back precautions (and no push, pull, lift)  Required Braces or Orthoses: Spinal Brace Spinal Brace: Applied in sitting position;Applied in standing position (Apsen lumbar fusion brace) Restrictions Weight Bearing Restrictions: No      Mobility  Bed Mobility Overal bed mobility: Needs Assistance Bed Mobility: Rolling;Sidelying to Sit Rolling: Min guard Sidelying to sit: Min assist       General bed mobility comments: for Log Roll technique  Transfers Overall transfer level: Needs assistance Equipment used: Rolling walker (2 wheeled);None Transfers: Sit to/from Stand Sit to Stand: Supervision            Ambulation/Gait Ambulation/Gait assistance: Min guard Ambulation Distance (Feet): 150 Feet Assistive device: Rolling walker (2 wheeled) Gait Pattern/deviations: Step-through pattern Gait velocity: Slow to normal       Stairs Stairs:  (needs to be assessed)          Wheelchair Mobility    Modified Rankin (Stroke Patients Only)       Balance Overall balance assessment: Needs assistance Sitting-balance support: Feet supported Sitting balance-Leahy Scale: Fair     Standing balance support: Single extremity supported Standing balance-Leahy Scale: Fair                               Pertinent Vitals/Pain Initially 4-5/10, reduced to 3-4/10 after therapy session.    Home Living Family/patient expects to be discharged to:: Private residence Living Arrangements: Spouse/significant other;Children Available Help at Discharge: Family Type of Home: House Home Access: Stairs to enter Entrance Stairs-Rails:  (One rail) Secretary/administrator of Steps: 4 Home Layout: One level        Prior Function Level of Independence: Independent               Hand Dominance   Dominant Hand: Right    Extremity/Trunk Assessment   Upper Extremity Assessment: Defer to OT evaluation           Lower Extremity Assessment: Overall WFL for tasks assessed         Communication   Communication: No difficulties  Cognition Arousal/Alertness: Awake/alert Behavior During Therapy: WFL for tasks assessed/performed Overall Cognitive Status: Within Functional Limits for tasks assessed                      General Comments      Exercises General Exercises - Lower Extremity Heel Slides: AAROM;Both;10 reps Hip ABduction/ADduction: AAROM;Both;10 reps      Assessment/Plan  PT Assessment Patient needs continued PT services  PT Diagnosis Difficulty walking;Acute pain   PT Problem List Decreased strength;Decreased mobility;Decreased knowledge of use of DME;Decreased activity tolerance;Pain  PT Treatment Interventions DME instruction;Gait training;Stair training;Functional mobility training;Therapeutic exercise;Therapeutic activities;Patient/family education   PT Goals  (Current goals can be found in the Care Plan section) Acute Rehab PT Goals Patient Stated Goal: to go home PT Goal Formulation: With patient Time For Goal Achievement: 08/30/13 Potential to Achieve Goals: Good    Frequency Min 5X/week   Barriers to discharge Inaccessible home environment Stairs need to be assessed    Co-evaluation               End of Session Equipment Utilized During Treatment: Gait belt;Back brace Activity Tolerance: Patient tolerated treatment well Patient left: in chair;with family/visitor present Nurse Communication: Mobility status         Time: [id number] PT Time Calculation (min): 25 min   Charges:   PT Evaluation $Initial PT Evaluation Tier I: 1 Procedure PT Treatments $Therapeutic Activity: 8-22 mins   PT G CodesFreida Busman:          Iva Montelongo L 08/23/2013, 3:59 PM

## 2013-08-23 NOTE — Progress Notes (Signed)
Subjective: 1 Day Post-Op Procedure(s) (LRB): POSTERIOR LUMBAR FUSION 1 LEVEL (Left) Patient reports pain as mild.  I have less pain than before surgery, my legs feel good.   Objective: Vital signs in last 24 hours: Temp:  [97.7 F (36.5 C)-98.5 F (36.9 C)] 98.5 F (36.9 C) (07/18 0651) Pulse Rate:  [57-90] 71 (07/18 0651) Resp:  [13-18] 16 (07/18 0651) BP: (113-142)/(50-78) 122/56 mmHg (07/18 0651) SpO2:  [98 %-100 %] 100 % (07/18 0651)  Intake/Output from previous day: 07/17 0701 - 07/18 0700 In: 4940 [P.O.:1080; I.V.:3410] Out: 1700 [Urine:1500; Blood:200] Intake/Output this shift: Total I/O In: 240 [P.O.:240] Out: -    Recent Labs  08/23/13 0425  HGB 10.4*    Recent Labs  08/23/13 0425  WBC 12.2*  RBC 3.61*  HCT 31.4*  PLT 170    Recent Labs  08/23/13 0425  NA 143  K 3.3*  CL 104  CO2 26  BUN 17  CREATININE 0.78  GLUCOSE 130*  CALCIUM 8.5   No results found for this basename: LABPT, INR,  in the last 72 hours  Neurologically intact  Assessment/Plan: 1 Day Post-Op Procedure(s) (LRB): POSTERIOR LUMBAR FUSION 1 LEVEL (Left) Up with therapy D/C IV fluids SL IV  Dinna Severs C 08/23/2013, 8:30 AM

## 2013-08-23 NOTE — Evaluation (Signed)
Occupational Therapy Evaluation Patient Details Name: Annette Jefferson MRN: [id number] DOB: [birth year] Today's Date: 08/23/2013    History of Present Illness 63 yo female admitted for spinal stenosis of lumbar region with s/p posterior lumbar fusion 1 level L4-5   Clinical Impression   Pta pt was independent with self care tasks and now presents with generalized weakness, acute pain and back precautions interfering with her independence with ADLs. Educated pt on compensatory strategies for ADL/IADLs, how to don/doff lumbar brace, proper bed mobility technique and precautions. Pt would benefit from additional acute OT to maximize independence and practice a tub transfer to the 3in1 and educate the pt on AE to assist with LB bathing/dressing. Will continue to follow prior to D/C home with supervision.    Follow Up Recommendations  No OT follow up;Supervision - Intermittent    Equipment Recommendations  3 in 1 bedside comode       Precautions / Restrictions Precautions Precautions: Back Precaution Booklet Issued: Yes (comment) Precaution Comments: reviewed back precautions (and no push, pull, lift)  Required Braces or Orthoses: Spinal Brace Spinal Brace: Applied in sitting position;Applied in standing position (Apsen lumbar fusion brace) Restrictions Weight Bearing Restrictions: No      Mobility Bed Mobility Overal bed mobility: Needs Assistance Bed Mobility: Rolling;Sidelying to Sit Rolling: Min guard Sidelying to sit: Min assist       General bed mobility comments: for LE positioning  Transfers Overall transfer level: Needs assistance Equipment used: Rolling walker (2 wheeled);None Transfers: Sit to/from Stand Sit to Stand: Modified independent (Device/Increase time)              Balance Overall balance assessment: Needs assistance Sitting-balance support: Feet supported Sitting balance-Leahy Scale: Fair     Standing balance support: No upper extremity  supported;During functional activity Standing balance-Leahy Scale: Fair                              ADL Overall ADL's : Needs assistance/impaired Eating/Feeding: Independent;Sitting   Grooming: Min guard;Standing   Upper Body Bathing: Set up;Sitting   Lower Body Bathing: Adhering to back precautions;Sit to/from stand;Minimal assistance;With adaptive equipment   Upper Body Dressing : Set up;Sitting   Lower Body Dressing: Minimal assistance;With adaptive equipment;Adhering to back precautions;Sit to/from stand   Toilet Transfer: Min guard;Ambulation;BSC   Toileting- Architect and Hygiene: Min guard;Adhering to back precautions;Sit to/from stand       Functional mobility during ADLs: Min guard General ADL Comments: Demonstrated how to apply back brace in sitting position and had pt tighten it upon standing. Educated pt on using 2 cups for oral care (one for spitting and one for rinsing), proper body mechanics while moving and adhering to back precautions, LB bathing/dressing technique using AE and the multiple uses of the 3in1.                Pertinent Vitals/Pain No c/o pain during tx, just stiffness. Pt was positioned in chair with pillow behind her.     Hand Dominance Right   Extremity/Trunk Assessment Upper Extremity Assessment Upper Extremity Assessment: Overall WFL for tasks assessed   Lower Extremity Assessment Lower Extremity Assessment: Defer to PT evaluation       Communication Communication Communication: No difficulties   Cognition Arousal/Alertness: Awake/alert Behavior During Therapy: WFL for tasks assessed/performed Overall Cognitive Status: Within Functional Limits for tasks assessed  Home Living Family/patient expects to be discharged to:: Private residence Living Arrangements: Spouse/significant other;Children Available Help at Discharge: Family Type of Home: House Home  Access: Stairs to enter Secretary/administratorntrance Stairs-Number of Steps: 4   Home Layout: One level     Bathroom Shower/Tub: Tub/shower unit Shower/tub characteristics: Engineer, building servicesCurtain Bathroom Toilet: Standard                Prior Functioning/Environment Level of Independence: Independent             OT Diagnosis: Generalized weakness;Acute pain   OT Problem List: Decreased strength;Decreased range of motion;Decreased knowledge of use of DME or AE;Impaired balance (sitting and/or standing);Pain   OT Treatment/Interventions: Self-care/ADL training;DME and/or AE instruction;Balance training;Patient/family education    OT Goals(Current goals can be found in the care plan section) Acute Rehab OT Goals Patient Stated Goal: to go home OT Goal Formulation: With patient Time For Goal Achievement: 08/30/13 Potential to Achieve Goals: Good ADL Goals Pt Will Perform Lower Body Bathing: with supervision;with adaptive equipment;sit to/from stand Pt Will Perform Lower Body Dressing: with supervision;with adaptive equipment;sit to/from stand Pt Will Transfer to Toilet: with supervision;ambulating;bedside commode (over toilet) Pt Will Perform Toileting - Clothing Manipulation and hygiene: with supervision;sit to/from stand Pt Will Perform Tub/Shower Transfer: with supervision;3 in 1;rolling walker  OT Frequency: Min 2X/week    End of Session Equipment Utilized During Treatment: Back brace  Activity Tolerance: Patient tolerated treatment well Patient left: in chair;with call bell/phone within reach   Time:  -    Charges:    G-CodesMaurene Capes:    Tamana Hatfield 08/23/2013, 3:10 PM

## 2013-08-23 NOTE — Evaluation (Signed)
I have read and agree with this note.   Time in/out:1415-1437 Total time:22 minutes (Ev, 1SC)  Ignacia Palmaathy Natoria Archibald, OTR/L [phone]

## 2013-08-23 NOTE — Progress Notes (Signed)
In to see patient however no brace in room. Made RN aware and she is going to contact ortho tech and/or Dr. Ophelia CharterYates about pt being up with therapy without brace. OT will see once clarified.  Ignacia PalmaCathy Vanita Cannell, [place] Carolina[place] [phone] 08/23/2013

## 2013-08-24 MED ORDER — PROMETHAZINE HCL 25 MG PO TABS
25.0000 mg | ORAL_TABLET | Freq: Four times a day (QID) | ORAL | Status: DC | PRN
  Administered 2013-08-24: 25 mg via ORAL
  Filled 2013-08-24: qty 1

## 2013-08-24 MED ORDER — PROMETHAZINE HCL 25 MG/ML IJ SOLN: 12.5000 mg | Freq: Four times a day (QID) | INTRAMUSCULAR | Status: DC | PRN

## 2013-08-24 NOTE — Progress Notes (Signed)
Physical Therapy Treatment Patient Details Name: Ignacia FellingLillie Rivere MRN: [id number] DOB: [birth year] Today's Date: 08/24/2013    History of Present Illness 63 yo female admitted for spinal stenosis of lumbar region with s/p posterior lumbar fusion 1 level L4-5    PT Comments    Pt moving well & able to complete session but with N/V at end of session.  Pt has received Zofran earlier this AM & not due for more at this time per RN.  Possible d/c home tomorrow per pt, she would like to review bed mobility before d/c.     Follow Up Recommendations  Home health PT;Supervision/Assistance - 24 hour     Equipment Recommendations  Rolling walker with 5" wheels    Recommendations for Other Services       Precautions / Restrictions Precautions Precautions: Back Precaution Booklet Issued: Yes (comment) Precaution Comments: pt able to independently recall 3/3 back precautions. Required Braces or Orthoses: Spinal Brace Restrictions Weight Bearing Restrictions: No    Mobility  Bed Mobility Overal bed mobility: Needs Assistance Bed Mobility: Sit to Sidelying         Sit to sidelying: Min assist General bed mobility comments: (A) to lift LE's back into bed.    Transfers Overall transfer level: Needs assistance Equipment used: Rolling walker (2 wheeled);None Transfers: Sit to/from Stand Sit to Stand: Supervision         General transfer comment: cues to reinforce hand placement  Ambulation/Gait Ambulation/Gait assistance: Supervision Ambulation Distance (Feet): 200 Feet (100' x 2) Assistive device: Rolling walker (2 wheeled) Gait Pattern/deviations: Step-through pattern Gait velocity: Slow to normal   General Gait Details: Pt with slow but steady gait.  Uses RW safely.     Stairs Stairs: Yes Stairs assistance: Supervision Stair Management: Two rails;Step to pattern;Forwards Number of Stairs: 3 General stair comments: cues for sequencing due to RLE pain (up with strong +  down with weak)  Wheelchair Mobility    Modified Rankin (Stroke Patients Only)       Balance                                    Cognition Arousal/Alertness: Awake/alert Behavior During Therapy: WFL for tasks assessed/performed Overall Cognitive Status: Within Functional Limits for tasks assessed                      Exercises      General Comments        Pertinent Vitals/Pain No pain reported; N/V at end of session    Home Living                      Prior Function            PT Goals (current goals can now be found in the care plan section) Acute Rehab PT Goals Patient Stated Goal: to go home PT Goal Formulation: With patient Time For Goal Achievement: 08/30/13 Potential to Achieve Goals: Good Progress towards PT goals: Progressing toward goals    Frequency  Min 5X/week    PT Plan Current plan remains appropriate    Co-evaluation             End of Session Equipment Utilized During Treatment: Back brace Activity Tolerance: Other (comment) (Completed session but with N/V at end of session) Patient left: in bed;with call bell/phone within reach     Time: 1031-1056 PT  Time Calculation (min): 25 min  Charges:  $Gait Training: 8-22 mins $Therapeutic Activity: 8-22 mins                    G Codes:      Lara Mulch 08/24/2013, 11:50 AM  Verdell Face, PTA [phone] 08/24/2013

## 2013-08-24 NOTE — Progress Notes (Signed)
Subjective: 2 Days Post-Op Procedure(s) (LRB): POSTERIOR LUMBAR FUSION 1 LEVEL (Left) Patient reports pain as 4 on 0-10 scale.    Objective: Vital signs in last 24 hours: Temp:  [99.2 F (37.3 C)-99.6 F (37.6 C)] 99.2 F (37.3 C) (07/19 0621) Pulse Rate:  [69-85] 69 (07/19 0621) Resp:  [16-24] 20 (07/19 0621) BP: (111-115)/(53-57) 111/57 mmHg (07/19 0621) SpO2:  [98 %] 98 % (07/19 0621)  Intake/Output from previous day: 07/18 0701 - 07/19 0700 In: 1080 [P.O.:1080] Out: -  Intake/Output this shift:     Recent Labs  08/23/13 0425  HGB 10.4*    Recent Labs  08/23/13 0425  WBC 12.2*  RBC 3.61*  HCT 31.4*  PLT 170    Recent Labs  08/23/13 0425  NA 143  K 3.3*  CL 104  CO2 26  BUN 17  CREATININE 0.78  GLUCOSE 130*  CALCIUM 8.5   No results found for this basename: LABPT, INR,  in the last 72 hours  Neurologically intact  Assessment/Plan: 2 Days Post-Op Procedure(s) (LRB): POSTERIOR LUMBAR FUSION 1 LEVEL (Left) Up with therapy  Home Monday  YATES,MARK C 08/24/2013, 7:43 AM

## 2013-08-24 NOTE — Progress Notes (Signed)
Occupational Therapy Treatment Patient Details Name: Annette Jefferson MRN: [id number] DOB: February 16, 1950 Today's Date: 08/24/2013    History of present illness 63 yo female admitted for spinal stenosis of lumbar region with s/p posterior lumbar fusion 1 level L4-5   OT comments  Pt progressing toward goals. Limited by nausea during session.   Follow Up Recommendations  No OT follow up;Supervision - Intermittent    Equipment Recommendations  3 in 1 bedside comode    Recommendations for Other Services      Precautions / Restrictions Precautions Precautions: Back Precaution Booklet Issued: Yes (comment) Precaution Comments: pt able to independently recall 3/3 back precautions. Required Braces or Orthoses: Spinal Brace       Mobility Bed Mobility               General bed mobility comments: Not assessed- pt up in chair.  Pt able to verbalize log roll technique.  Transfers Overall transfer level: Needs assistance Equipment used: Rolling walker (2 wheeled);None Transfers: Sit to/from Stand Sit to Stand: Supervision              Balance                                   ADL Overall ADL's : Needs assistance/impaired             Lower Body Bathing: Min guard;Sit to/from stand;Adhering to back precautions Lower Body Bathing Details (indicate cue type and reason): Pt able to cross ankles over knees.     Lower Body Dressing: Minimal assistance;Sit to/from stand;Cueing for back precautions Lower Body Dressing Details (indicate cue type and reason): Pt able to cross ankles over knees but still some difficulty with reaching to get sock over toes.                General ADL Comments: Pt c/o nausea during session, reports "i feel like I'm going to throw up."  OT instructed on use of AE for LB bathing/dressing and for toileting.  Educated pt on acquisition of AE in gift shop. She reports she will have her daughter get a toilet aid for her in gift shop.   Pt with some stiffness with crossing legs to complete LB ADLs, but anticipate this will get easier as she heals.  Pt reports her daughter will be home to assist with ADLs.  Pt has a tub shower at home. OT demonstrated tub transfer technique (side stepping over ledge, leading with "strong" leg). Pt too nauseous to attempt tub transfer at this time. Will complete next session.      Vision                     Perception     Praxis      Cognition   Behavior During Therapy: WFL for tasks assessed/performed Overall Cognitive Status: Within Functional Limits for tasks assessed                       Extremity/Trunk Assessment               Exercises     Shoulder Instructions       General Comments      Pertinent Vitals/ Pain       See vitals tab  Home Living  Prior Functioning/Environment              Frequency Min 2X/week     Progress Toward Goals  OT Goals(current goals can now be found in the care plan section)  Progress towards OT goals: Progressing toward goals  Acute Rehab OT Goals Patient Stated Goal: to go home OT Goal Formulation: With patient Time For Goal Achievement: 08/30/13 Potential to Achieve Goals: Good ADL Goals Pt Will Perform Lower Body Bathing: with supervision;with adaptive equipment;sit to/from stand Pt Will Perform Lower Body Dressing: with supervision;with adaptive equipment;sit to/from stand Pt Will Transfer to Toilet: with supervision;ambulating;bedside commode Pt Will Perform Toileting - Clothing Manipulation and hygiene: with supervision;sit to/from stand Pt Will Perform Tub/Shower Transfer: with supervision;3 in 1;rolling walker  Plan Discharge plan remains appropriate    Co-evaluation                 End of Session Equipment Utilized During Treatment: Back brace   Activity Tolerance  (pt limited by nausea)   Patient Left in chair;with call  bell/phone within reach   Nurse Communication          Time: 4098-11910808-0831 OT Time Calculation (min): 23 min  Charges: OT General Charges $OT Visit: 1 Procedure OT Treatments $Self Care/Home Management : 23-37 mins  Cipriano MileJohnson, Jenna Elizabeth 08/24/2013, 9:00 AM  08/24/2013 Cipriano MileJohnson, Jenna Elizabeth OTR/L Pager [phone] Office [phone]

## 2013-08-25 MED FILL — Sodium Chloride IV Soln 0.9%: INTRAVENOUS | Qty: 1000 | Status: AC

## 2013-08-25 MED FILL — Heparin Sodium (Porcine) Inj 1000 Unit/ML: INTRAMUSCULAR | Qty: 30 | Status: AC

## 2013-08-25 NOTE — Progress Notes (Signed)
Occupational Therapy Treatment and Discharge Patient Details Name: Annette Jefferson MRN: [id number] DOB: [birth year] Today's Date: 08/25/2013    History of present illness 63 yo female admitted for spinal stenosis of lumbar region with s/p posterior lumbar fusion 1 level L4-5   OT comments  Focus of today's session was on practicing a tub transfer to the [place] and reviewing AE for LB bathing/dressing/toileting. All goals have been met and education completed. Pt feels confident in self care tasks and will have supervision to help with ADLs at home, therefore no further OT is needed. We will sign off.  Follow Up Recommendations  No OT follow up;Supervision - Intermittent    Equipment Recommendations  3 in 1 bedside comode       Precautions / Restrictions Precautions Precautions: Back Precaution Comments: pt able to independently recall 3/3 back precautions. Required Braces or Orthoses: Spinal Brace Spinal Brace: Lumbar corset;Applied in sitting position Restrictions Weight Bearing Restrictions: No       Mobility Bed Mobility General bed mobility comments: Pt up in chair upon OT tx.  Transfers Overall transfer level: Needs assistance Equipment used: Rolling walker (2 wheeled) Transfers: Sit to/from Stand Sit to Stand: Supervision              Balance Overall balance assessment: Needs assistance Sitting-balance support: Feet supported Sitting balance-Leahy Scale: Fair     Standing balance support: Single extremity supported Standing balance-Leahy Scale: Fair                     ADL Overall ADL's : Needs assistance/impaired             Lower Body Bathing: Sit to/from stand;Adhering to back precautions;Supervison/ safety;With adaptive equipment       Lower Body Dressing: Sit to/from stand;Supervision/safety;With adaptive equipment;Adhering to back precautions   Toilet Transfer: Ambulation;BSC;Supervision/safety;RW   Toileting- Clothing Manipulation  and Hygiene: Adhering to back precautions;Sit to/from stand;Supervision/safety;With adaptive equipment   Tub/ Shower Transfer: Tub transfer;Supervision/safety;3 in 1;Rolling walker;Ambulation   Functional mobility during ADLs: Supervision/safety General ADL Comments: Practiced tub transfer to 3in1 and reviewed AE for LB bathing/dressing. Educated pt on where to get AE in gift shop.                Cognition   Behavior During Therapy: WFL for tasks assessed/performed Overall Cognitive Status: Within Functional Limits for tasks assessed                                    Pertinent Vitals/ Pain       No c/o pain during tx.         Frequency Min 2X/week     Progress Toward Goals  OT Goals(current goals can now be found in the care plan section)  Progress towards OT goals: Goals met/education completed, patient discharged from OT  Acute Rehab OT Goals Patient Stated Goal: Garden OT Goal Formulation: With patient Time For Goal Achievement: 08/30/13 Potential to Achieve Goals: Good  Plan Discharge plan remains appropriate       End of Session Equipment Utilized During Treatment: Back brace   Activity Tolerance Patient tolerated treatment well   Patient Left in chair;with call bell/phone within reach           Time:  -     Charges:    Lyda Perone 08/25/2013, 9:48 AM

## 2013-08-25 NOTE — Progress Notes (Signed)
I have read and agree with this note.   Time in/out:[id number] Total time: 19 minutes (1SC)  Ignacia Palmaathy Lulie Hurd, OTR/L [phone]

## 2013-08-25 NOTE — Progress Notes (Signed)
Physical Therapy Treatment Patient Details Name: Annette FellingLillie Jefferson MRN: [id number] DOB: 1950-03-10 Today's Date: 08/25/2013    History of Present Illness 63 yo female admitted for spinal stenosis of lumbar region with s/p posterior lumbar fusion 1 level L4-5    PT Comments    Pt doing great today.  No c/o nausea since yesterday pm.  Reviewed bed mobility and reinforced back precautions with mobility.  Pt at S level for all mobility.  Pt ready for D/C from PT stand point.    Follow Up Recommendations  Home health PT;Supervision/Assistance - 24 hour     Equipment Recommendations  Rolling walker with 5" wheels    Recommendations for Other Services       Precautions / Restrictions Precautions Precautions: Back Precaution Comments: pt able to independently recall 3/3 back precautions. Required Braces or Orthoses: Spinal Brace Spinal Brace: Lumbar corset;Applied in sitting position Restrictions Weight Bearing Restrictions: No    Mobility  Bed Mobility Overal bed mobility: Needs Assistance Bed Mobility: Rolling;Sidelying to Sit;Sit to Sidelying Rolling: Supervision Sidelying to sit: Supervision     Sit to sidelying: Supervision General bed mobility comments: repeated bed mobility x2 for practice and reinforcing technique.  cues for preventing twisting with log roll and sequencing.    Transfers Overall transfer level: Needs assistance Equipment used: Rolling walker (2 wheeled) Transfers: Sit to/from Stand Sit to Stand: Supervision            Ambulation/Gait Ambulation/Gait assistance: Supervision Ambulation Distance (Feet): 500 Feet Assistive device: Rolling walker (2 wheeled) Gait Pattern/deviations: Step-through pattern;Decreased stride length     General Gait Details: pt moving well and steadily.     Stairs            Wheelchair Mobility    Modified Rankin (Stroke Patients Only)       Balance                                     Cognition Arousal/Alertness: Awake/alert Behavior During Therapy: WFL for tasks assessed/performed Overall Cognitive Status: Within Functional Limits for tasks assessed                      Exercises      General Comments        Pertinent Vitals/Pain "Not pain, but stiffness."      Home Living                      Prior Function            PT Goals (current goals can now be found in the care plan section) Acute Rehab PT Goals Patient Stated Goal: Garden Time For Goal Achievement: 08/30/13 Potential to Achieve Goals: Good Progress towards PT goals: Progressing toward goals    Frequency  Min 5X/week    PT Plan Current plan remains appropriate    Co-evaluation             End of Session Equipment Utilized During Treatment: Back brace Activity Tolerance: Patient tolerated treatment well Patient left: in chair;with call bell/phone within reach     Time: 0758-0827 PT Time Calculation (min): 29 min  Charges:  $Gait Training: 8-22 mins $Therapeutic Activity: 8-22 mins                    G CodesSunny Schlein:      Nuvia Hileman F, [place]PT [phone] 08/25/2013,  8:36 AM

## 2013-08-25 NOTE — Care Management Note (Signed)
CARE MANAGEMENT NOTE 08/25/2013  Patient:  Annette Jefferson Jefferson,Annette   Account Number:  [id number]  Date Initiated:  08/25/2013  Documentation initiated by:  Vance PeperBRADY,Vencent Hauschild  Subjective/Objective Assessment:   63 yr old female s/p L4-5 fusion.     Action/Plan:   case manager spoke with patient concerning home health and DME needs . Choice offered. [facility]. Patient has family support at discharge.   Anticipated DC Date:  08/25/2013   Anticipated DC Plan:  HOME W HOME HEALTH SERVICES      DC Planning Services  CM consult      [facility]  [facility]ALTH  DURABLE MEDICAL EQUIPMENT   Choice offered to / List presented to:  C-1 Patient   DME arranged  [facility]  3-N-1      DME agency  [facility]     HH arranged  HH-2 PT      [facility]  [facility]   Status of service:  Completed, signed off Medicare Important Message given?   (If response is "NO", the following Medicare IM given date fields will be blank) Date Medicare IM given:   Medicare IM given by:   Date Additional Medicare IM given:   Additional Medicare IM given by:    Discharge Disposition:  HOME W HOME HEALTH SERVICES  Per UR Regulation:  Reviewed for med. necessity/level of care/duration of stay  If discussed at Long Length of Stay Meetings, dates discussed:

## 2013-08-25 NOTE — Progress Notes (Signed)
Patient discharged to home accompanied by family. Discharge instructions and rx given and explained and patient stated understanding. IV was removed and patient left unit in a stable condition via wheelchair with all personal belongings. 

## 2013-08-25 NOTE — Progress Notes (Signed)
Subjective: 3 Days Post-Op Procedure(s) (LRB): POSTERIOR LUMBAR FUSION 1 LEVEL (Left) Patient reports pain as 3 on 0-10 scale.    Objective: Vital signs in last 24 hours: Temp:  [98.6 F (37 C)-99.6 F (37.6 C)] 98.8 F (37.1 C) (07/20 0626) Pulse Rate:  [65-75] 65 (07/20 0626) Resp:  [18] 18 (07/20 0626) BP: (94-136)/(50-70) 135/70 mmHg (07/20 0626) SpO2:  [96 %-97 %] 97 % (07/20 0626)  Intake/Output from previous day: 07/19 0701 - 07/20 0700 In: 1110 [P.O.:1110] Out: 650 [Blood:650] Intake/Output this shift:     Recent Labs  08/23/13 0425  HGB 10.4*    Recent Labs  08/23/13 0425  WBC 12.2*  RBC 3.61*  HCT 31.4*  PLT 170    Recent Labs  08/23/13 0425  NA 143  K 3.3*  CL 104  CO2 26  BUN 17  CREATININE 0.78  GLUCOSE 130*  CALCIUM 8.5   No results found for this basename: LABPT, INR,  in the last 72 hours  Neurovascular intact Sensation intact distally Intact pulses distally Dorsiflexion/Plantar flexion intact Incision: no drainage  Assessment/Plan: 3 Days Post-Op Procedure(s) (LRB): POSTERIOR LUMBAR FUSION 1 LEVEL (Left) Discharge home today  Annette Jefferson 08/25/2013, 11:31 AM

## 2013-09-01 NOTE — Discharge Summary (Signed)
Physician Discharge Summary  Patient ID: Annette Jefferson MRN: [id number] DOB/AGE: 1950/08/16 63 y.o.  Admit date: 08/22/2013 Discharge date: 08/25/2013  Admission Diagnoses:  Spinal stenosis of lumbar region L4-5 spondylolisthesis, instability,  intraspinal extradural facet cyst, radiculopathy. L4-5 disk protrusion, central , lateral recess and foraminal stenosis.   Discharge Diagnoses:  Principal Problem:   Spinal stenosis of lumbar region L4-5 spondylolisthesis, instability,  intraspinal extradural facet cyst, radiculopathy. L4-5 disk protrusion, central , lateral recess and foraminal stenosis.    Past Medical History  Diagnosis Date  . Hypertension   . Hyperlipemia   . PONV (postoperative nausea and vomiting)     last time 2005  . GERD (gastroesophageal reflux disease)   . Arthritis     Surgeries: Procedure(s): POSTERIOR LUMBAR FUSION 1 LEVEL on 08/22/2013  Excision of extradural intraspinal facet cyst left L4-5  TLIF, Gill procedure, removal of abnormal posterior elements. Pedicle  instrumentation and bilateral fusion with local bone.    Consultants (if any):  none  Discharged Condition: Improved  Hospital Course: Annette Jefferson is an 63 y.o. female who was admitted 08/22/2013 with a diagnosis of Spinal stenosis of lumbar region and went to the operating room on 08/22/2013 and underwent the above named procedures.    She was given perioperative antibiotics:      Anti-infectives   Start     Dose/Rate Route Frequency Ordered Stop   08/22/13 1345  ceFAZolin (ANCEF) IVPB 1 g/50 mL premix     1 g 100 mL/hr over 30 Minutes Intravenous Every 8 hours 08/22/13 1322 08/22/13 2215   08/22/13 0600  ceFAZolin (ANCEF) IVPB 2 g/50 mL premix     2 g 100 mL/hr over 30 Minutes Intravenous On call to O.R. 08/21/13 1441 08/22/13 0755    .  She was given sequential compression devices, early ambulation for DVT prophylaxis.  She benefited maximally from the hospital stay and there  were no complications.    Recent vital signs:  Filed Vitals:   08/25/13 0626  BP: 135/70  Pulse: 65  Temp: 98.8 F (37.1 C)  Resp: 18    Recent laboratory studies:  Lab Results  Component Value Date   HGB 10.4* 08/23/2013   HGB 13.0 08/12/2013   Lab Results  Component Value Date   WBC 12.2* 08/23/2013   PLT 170 08/23/2013   Lab Results  Component Value Date   INR 1.05 08/12/2013   Lab Results  Component Value Date   NA 143 08/23/2013   K 3.3* 08/23/2013   CL 104 08/23/2013   CO2 26 08/23/2013   BUN 17 08/23/2013   CREATININE 0.78 08/23/2013   GLUCOSE 130* 08/23/2013    Discharge Medications:     Medication List         AZOR 10-40 MG per tablet  Generic drug:  amLODipine-olmesartan  Take 1 tablet by mouth daily.     calcium carbonate 500 MG chewable tablet  Commonly known as:  TUMS - dosed in mg elemental calcium  Chew 1 tablet by mouth as needed for indigestion or heartburn.     chlorthalidone 25 MG tablet  Commonly known as:  HYGROTON  Take 12.5 mg by mouth daily.     fluticasone 50 MCG/ACT nasal spray  Commonly known as:  FLONASE  Place 1 spray into both nostrils daily.     meloxicam 15 MG tablet  Commonly known as:  MOBIC  Take 15 mg by mouth daily.     methocarbamol 500 MG tablet  Commonly known as:  ROBAXIN  Take 1 tablet (500 mg total) by mouth every 6 (six) hours as needed for muscle spasms (spasm).     nebivolol 10 MG tablet  Commonly known as:  BYSTOLIC  Take 10 mg by mouth at bedtime.     omeprazole 20 MG tablet  Commonly known as:  PRILOSEC OTC  Take 20 mg by mouth as needed.     oxyCODONE-acetaminophen 5-325 MG per tablet  Commonly known as:  ROXICET  Take 1 tablet by mouth every 4 (four) hours as needed.     rosuvastatin 40 MG tablet  Commonly known as:  CRESTOR  Take 20 mg by mouth daily.        Diagnostic Studies: Dg Chest 2 View  08/12/2013   CLINICAL DATA:  Up retention, lumbar spondylolisthesis.  EXAM: CHEST  2 VIEW   COMPARISON:  None.  FINDINGS: The heart size and mediastinal contours are within normal limits. Both lungs are clear. No pneumothorax or pleural effusion is noted. The visualized skeletal structures are unremarkable.  IMPRESSION: No acute cardiopulmonary abnormality seen.   Electronically Signed   By: Roque Lias M.D.   On: 08/12/2013 15:14   Dg Lumbar Spine 2-3 Views  08/22/2013   CLINICAL DATA:  63 year old female undergoing lumbar surgery. Initial encounter.  EXAM: DG C-ARM 61-120 MIN; LUMBAR SPINE - 2-3 VIEW  TECHNIQUE: Two intraoperative fluoroscopic images of the lumbar spine.  CONTRAST:  None.  FLUOROSCOPY TIME:  0 min 18 seconds.  COMPARISON:  [facility] lumbar MRI 07/17/2013.  FINDINGS: Same numbering system as on the comparison. These images demonstrate transpedicular and interbody fusion hardware placement at L4-L5.  IMPRESSION: L4-L5 posterior and interbody fusion depicted.   Electronically Signed   By: Augusto Gamble M.D.   On: 08/22/2013 11:19   Dg C-arm 1-60 Min  08/22/2013   CLINICAL DATA:  64 year old female undergoing lumbar surgery. Initial encounter.  EXAM: DG C-ARM 61-120 MIN; LUMBAR SPINE - 2-3 VIEW  TECHNIQUE: Two intraoperative fluoroscopic images of the lumbar spine.  CONTRAST:  None.  FLUOROSCOPY TIME:  0 min 18 seconds.  COMPARISON:  [facility] lumbar MRI 07/17/2013.  FINDINGS: Same numbering system as on the comparison. These images demonstrate transpedicular and interbody fusion hardware placement at L4-L5.  IMPRESSION: L4-L5 posterior and interbody fusion depicted.   Electronically Signed   By: Augusto Gamble M.D.   On: 08/22/2013 11:19    Disposition: 01-Home or Self Care  Discharge Instructions   Call MD / Call 911    Complete by:  As directed   If you experience chest pain or shortness of breath, CALL 911 and be transported to the hospital emergency room.  If you develope a fever above 101 F, pus (white drainage) or increased  drainage or redness at the wound, or calf pain, call your surgeon's office.     Constipation Prevention    Complete by:  As directed   Drink plenty of fluids.  Prune juice may be helpful.  You may use a stool softener, such as Colace (over the counter) 100 mg twice a day.  Use MiraLax (over the counter) for constipation as needed.     Diet - low sodium heart healthy    Complete by:  As directed      Discharge instructions    Complete by:  As directed   Call if there is increasing drainage, fever greater than 101.5, severe head aches, and worsening nausea or light sensitivity.  If shortness of breath, bloody cough or chest tightness or pain go to an emergency room. No lifting greater than 10 lbs. Avoid bending, stooping and twisting. Use brace when sitting and out of bed even to go to bathroom. Walk in house for first 2 weeks then may start to get out slowly increasing distances up to one mile by 4-6 weeks post op. After 5 days may shower and change dressing following bathing with shower.When bathing remove the brace shower and replace brace before getting out of the shower. If drainage, keep dry dressing and do not bathe the incision, use an moisture impervious dressing. Please call and return for scheduled follow up appointment 2 weeks from the time of surgery.     Increase activity slowly as tolerated    Complete by:  As directed            Follow-up Information   Follow up with Eldred MangesYATES,MARK C, MD. Schedule an appointment as soon as possible for a visit in 2 weeks.   Specialty:  Orthopedic Surgery   Contact information:   [address] [place] [address] [phone]       Follow up with [facility]. (Someone from [facility] will contact you concerning start date and time for therapy.)    Contact information:   [address] [place] [address] [phone]        Signed: Wende NeighborsVERNON,Eshaan Titzer M 09/01/2013, 3:09 PM

## 2016-04-26 ENCOUNTER — Ambulatory Visit (INDEPENDENT_AMBULATORY_CARE_PROVIDER_SITE_OTHER): Payer: BLUE CROSS/BLUE SHIELD | Admitting: Orthopaedic Surgery

## 2016-10-14 ENCOUNTER — Encounter (HOSPITAL_BASED_OUTPATIENT_CLINIC_OR_DEPARTMENT_OTHER): Payer: Self-pay | Admitting: *Deleted

## 2016-10-14 ENCOUNTER — Emergency Department (HOSPITAL_BASED_OUTPATIENT_CLINIC_OR_DEPARTMENT_OTHER)
Admission: EM | Admit: 2016-10-14 | Discharge: 2016-10-14 | Disposition: A | Discharge status: Home / Self Care | Payer: BLUE CROSS/BLUE SHIELD | Attending: Physician Assistant | Admitting: Physician Assistant

## 2016-10-14 DIAGNOSIS — I10 Essential (primary) hypertension: Secondary | ICD-10-CM | POA: Insufficient documentation

## 2016-10-14 DIAGNOSIS — M25562 Pain in left knee: Secondary | ICD-10-CM | POA: Insufficient documentation

## 2016-10-14 MED ORDER — KETOROLAC TROMETHAMINE 60 MG/2ML IM SOLN
30.0000 mg | Freq: Once | INTRAMUSCULAR | Status: AC
  Administered 2016-10-14: 30 mg via INTRAMUSCULAR
  Filled 2016-10-14: qty 2

## 2016-10-14 NOTE — ED Triage Notes (Signed)
Pt reports L knee pain since yesterday. Denies known injury, fall. States she thinks it's her arthritis. Pt able to ambulate without difficulty.

## 2016-10-14 NOTE — Discharge Instructions (Signed)
Please read and follow all provided instructions.  Your diagnoses today include:  1. Acute pain of left knee     Tests performed today include:  Vital signs. See below for your results today.   Medications prescribed:   None  Take any prescribed medications only as directed.  Home care instructions:   Follow any educational materials contained in this packet  Follow R.I.C.E. Protocol:  R - rest your injury   I  - use ice on injury without applying directly to skin  C - compress injury with bandage or splint  E - elevate the injury as much as possible  Follow-up instructions: Please follow-up with your primary care provider for consideration of joint injection.   Return instructions:   Please return if your toes or feet are numb or tingling, appear gray or blue, or you have severe pain (also elevate the leg and loosen splint or wrap if you were given one)  Please return to the Emergency Department if you experience worsening symptoms.   Please return if you have any other emergent concerns.  Additional Information:  Your vital signs today were: BP (!) 185/93 (BP Location: Right Arm)    Pulse 86    Temp 98.4 F (36.9 C) (Oral)    Resp 20    Ht  (1.651 m)    Wt 81.6 kg (180 lb)    SpO2 98%    BMI 29.95 kg/m  If your blood pressure (BP) was elevated above 135/85 this visit, please have this repeated by your doctor within one month. --------------

## 2016-10-14 NOTE — ED Provider Notes (Signed)
[facility] Provider Note   CSN: [id number] Arrival date & time: 10/14/16  1038     History   Chief Complaint Chief Complaint  Patient presents with  . Knee Pain    HPI Annette Jefferson is a 66 y.o. female.  Patient with history of osteoarthritis, hypertension presents with complaint of acute onset of left knee pain yesterday with mild swelling. Course is constant, worse with movement, unchanged throughout the day. She denies any injuries or falls. She relates this to her osteoarthritis. She presents to the emergency department today because she is going to an outdoor concert and is requesting an injection in her left knee for pain. She has had a similar injection by her primary care doctor in her right knee in the past which helped. She does not have any history of gout or rheumatoid arthritis. No fevers or chills. Patient started taking meloxicam last night which does provide some relief. Denies history of renal problems, ulcer disease or GI bleeding.      Past Medical History:  Diagnosis Date  . Arthritis   . GERD (gastroesophageal reflux disease)   . Hyperlipemia   . Hypertension   . PONV (postoperative nausea and vomiting)    last time 2005    Patient Active Problem List   Diagnosis Date Noted  . Spinal stenosis of lumbar region 08/22/2013    Past Surgical History:  Procedure Laterality Date  . BREAST SURGERY Bilateral    cyst removed  . buniectomy Left 2005  . Buninectomy Right 2013  . COLONOSCOPY      OB History    No data available       Home Medications    Prior to Admission medications   Medication Sig Start Date End Date Taking? Authorizing Provider  amLODipine (NORVASC) 5 MG tablet Take 5 mg by mouth daily.   Yes [provider]  calcium carbonate (TUMS - DOSED IN MG ELEMENTAL CALCIUM) 500 MG chewable tablet Chew 1 tablet by mouth as needed for indigestion or heartburn.   Yes [provider]  fluticasone (FLONASE) 50  MCG/ACT nasal spray Place 1 spray into both nostrils daily.   Yes [provider]  hydrochlorothiazide (HYDRODIURIL) 25 MG tablet Take 25 mg by mouth 3 (three) times daily.   Yes [provider]  losartan-hydrochlorothiazide (HYZAAR) 50-12.5 MG tablet Take 1 tablet by mouth daily.   Yes [provider]  meloxicam (MOBIC) 15 MG tablet Take 15 mg by mouth daily.    Yes [provider]  omeprazole (PRILOSEC OTC) 20 MG tablet Take 20 mg by mouth as needed.   Yes [provider]  UNKNOWN TO PATIENT    Yes [provider]  amLODipine-olmesartan (AZOR) 10-40 MG per tablet Take 1 tablet by mouth daily.    [provider]  chlorthalidone (HYGROTON) 25 MG tablet Take 12.5 mg by mouth daily.    [provider]  methocarbamol (ROBAXIN) 500 MG tablet Take 1 tablet (500 mg total) by mouth every 6 (six) hours as needed for muscle spasms (spasm). 08/22/13   Maud Deed, PA-C  nebivolol (BYSTOLIC) 10 MG tablet Take 10 mg by mouth at bedtime.    [provider]  oxyCODONE-acetaminophen (ROXICET) 5-325 MG per tablet Take 1 tablet by mouth every 4 (four) hours as needed. 08/22/13   Maud Deed, PA-C  rosuvastatin (CRESTOR) 40 MG tablet Take 20 mg by mouth daily.     [provider]    Family History No  family history on file.  Social History Social History  Substance Use Topics  . Smoking status: Never Smoker  . Smokeless tobacco: Never Used  . Alcohol use No     Allergies   Asa [aspirin]; Codeine; and Tramadol   Review of Systems Review of Systems  Constitutional: Negative for activity change.  Musculoskeletal: Positive for arthralgias and joint swelling. Negative for back pain and neck pain.  Skin: Negative for wound.  Neurological: Negative for weakness and numbness.     Physical Exam Updated Vital Signs BP (!) 185/93 (BP Location: Right Arm)   Pulse 86   Temp 98.4 F (36.9 C) (Oral)   Resp 20    Ht  (1.651 m)   Wt 81.6 kg (180 lb)   SpO2 98%   BMI 29.95 kg/m   Physical Exam  Constitutional: She appears well-developed and well-nourished.  HENT:  Head: Normocephalic and atraumatic.  Eyes: Pupils are equal, round, and reactive to light.  Neck: Normal range of motion. Neck supple.  Cardiovascular: Normal pulses.  Exam reveals no decreased pulses.   Pulses:      Dorsalis pedis pulses are 2+ on the left side.  Musculoskeletal: She exhibits tenderness. She exhibits no edema.       Left hip: Normal.       Left knee: She exhibits normal range of motion, no swelling and no effusion. Tenderness found. Medial joint line and lateral joint line tenderness noted.       Left ankle: Normal.  Neurological: She is alert. No sensory deficit.  Motor, sensation, and vascular distal to the injury is fully intact.   Skin: Skin is warm and dry.  Psychiatric: She has a normal mood and affect.  Nursing note and vitals reviewed.    ED Treatments / Results  Labs (all labs ordered are listed, but only abnormal results are displayed) Labs Reviewed - No data to display  EKG  EKG Interpretation None       Radiology No results found.  Procedures Procedures (including critical care time)  Medications Ordered in ED Medications  ketorolac (TORADOL) injection 30 mg (30 mg Intramuscular Given 10/14/16 1120)     Initial Impression / Assessment and Plan / ED Course  I have reviewed the triage vital signs and the nursing notes.  Pertinent labs & imaging results that were available during my care of the patient were reviewed by me and considered in my medical decision making (see chart for details).     Patient seen and examined.    Vital signs reviewed and are as follows: BP (!) 185/93 (BP Location: Right Arm)   Pulse 86   Temp 98.4 F (36.9 C) (Oral)   Resp 20   Ht  (1.651 m)   Wt 81.6 kg (180 lb)   SpO2 98%   BMI 29.95 kg/m   Discussed with patient that we do not do  retain joint injections here in emergency department. I offered IM Toradol for temporary relief and continued anti-inflammatory treatment. She does not have any history of GI bleeding or chronic kidney disease. Otherwise discussed rice protocol. She will continue meloxicam at home. She states she will follow-up with her primary care doctor for consideration for joint injection on Monday (2 days).  Final Clinical Impressions(s) / ED Diagnoses   Final diagnoses:  Acute pain of left knee   Patient with left knee pain likely due to osteoarthritis. No acute injury which would necessitate imaging. No significant effusion present. No  overlying redness or other systemic symptoms to suggest septic arthritis. No history of gout or pseudogout. History of rheumatoid arthritis.  New Prescriptions New Prescriptions   No medications on file     Renne CriglerGeiple, Tawanna Funk, Cordelia Poche-C 10/14/16 1126    Mackuen, Cindee Saltourteney Lyn, MD 10/14/16 1542

## 2017-05-29 ENCOUNTER — Ambulatory Visit (INDEPENDENT_AMBULATORY_CARE_PROVIDER_SITE_OTHER): Payer: BLUE CROSS/BLUE SHIELD | Admitting: Orthopaedic Surgery

## 2017-05-29 ENCOUNTER — Encounter (INDEPENDENT_AMBULATORY_CARE_PROVIDER_SITE_OTHER): Payer: Self-pay | Admitting: Orthopaedic Surgery

## 2017-05-29 ENCOUNTER — Ambulatory Visit (INDEPENDENT_AMBULATORY_CARE_PROVIDER_SITE_OTHER): Payer: BLUE CROSS/BLUE SHIELD

## 2017-05-29 VITALS — BP 170/86 | HR 80 | Ht 66.0 in | Wt 190.0 lb

## 2017-05-29 DIAGNOSIS — M5416 Radiculopathy, lumbar region: Secondary | ICD-10-CM | POA: Diagnosis not present

## 2017-05-29 DIAGNOSIS — M4316 Spondylolisthesis, lumbar region: Secondary | ICD-10-CM | POA: Diagnosis not present

## 2017-05-29 DIAGNOSIS — M545 Low back pain: Secondary | ICD-10-CM

## 2017-05-29 NOTE — Progress Notes (Signed)
Office Visit Note   Patient: Annette Jefferson           Date of Birth: 09-06-1950           MRN: [id number] Visit Date: 05/29/2017              Requested by: No referring provider defined for this encounter. PCP: System, Pcp Not In   Assessment & Plan: Visit Diagnoses:  1. Low back pain, unspecified back pain laterality, unspecified chronicity, with sciatica presence unspecified   2. Spondylolisthesis of lumbar region   3. Radiculopathy, lumbar region     Plan: With patient's worsening symptoms that have failed conservative treatment with activity modification, prednisone taper, Mobic recommend getting a lumbar MRI to rule out HNP/stenosis.  I did compare today's x-rays to previous films 2015 and patient does have new findings at L3-4 with L3 retrolisthesis of about 4 mm.  Follow-up in the office with Dr. Ophelia Charter after completion of her study to discuss results and further treatment options.  Follow-Up Instructions: Return in about 3 weeks (around 06/19/2017) for with Dr Ophelia Charter to review MRI lumbar.   Orders:  Orders Placed This Encounter  Procedures  . XR Lumbar Spine 2-3 Views  . MR Lumbar Spine W Wo Contrast   No orders of the defined types were placed in this encounter.     Procedures: No procedures performed   Clinical Data: No additional findings.   Subjective: Chief Complaint  Patient presents with  . Right Hip - Pain    HPI [age]-year-old black female comes in today with complaints of worsening right-sided low back, right posterior hip pain and right lower extremity radiculopathy.  Patient is status post L4-5 instrumented fusion by Dr. Ophelia Charter August 20, 2013.  We have not seen patient since she was around 2 months postop.  Reports that she had been doing great and was extremely pleased with her surgical result up until onset of symptoms early March 2019.  States that she began having pain in the right posterior hip that radiated down her thigh below her knee to the  right foot.  No radicular symptoms on the left.  She is also had some intermittent numbness and tingling down the right lower extremity.  Sitting and bending aggravate symptoms.  She was seen in urgent care April 20, 2017 and was prescribed a prednisone taper that did give some improvement for a couple of weeks.  Symptoms have since returned.  No complaints of bowel or bladder incontinence.  Patient is also tried conservative treatment with Mobic and activity modification.  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 Review of Systems No current cardiac pulmonary GI GU issues  Objective: Vital Signs: BP (!) 170/86   Pulse 80   Ht 5\' 6"  (1.676 m)   Wt 190 lb (86.2 kg)   BMI 30.67 kg/m   Physical Exam  Constitutional: She appears well-developed. No distress.  HENT:  Head: Normocephalic and atraumatic.  Eyes: Pupils are equal, round, and reactive to light. EOM are normal.  Pulmonary/Chest: No respiratory distress.  Musculoskeletal:  Gait is somewhat antalgic.  Right-sided lumbar paraspinal tenderness.  Moderate to marked right sciatic notch tenderness.  Negative on the left side.  Bilateral SI joints nontender.  Bilateral hip greater trochanter bursa nontender.  Negative logroll.  Positive right straight leg raise and Negative on the left side.  No focal motor deficits.  Bilateral calves nontender.  Skin: Skin is warm and dry.  Psychiatric: She has a normal mood and affect.    Ortho Exam  Specialty Comments:  No specialty comments available.  Imaging: No results found.   PMFS History: Patient Active Problem List   Diagnosis Date Noted  . Spinal stenosis of lumbar region 08/22/2013   Past Medical History:  Diagnosis Date  . Arthritis   . GERD (gastroesophageal reflux disease)   . Hyperlipemia   . Hypertension   . PONV (postoperative nausea and vomiting)    last time 2005    No family  history on file.  Past Surgical History:  Procedure Laterality Date  . BREAST SURGERY Bilateral    cyst removed  . buniectomy Left 2005  . Buninectomy Right 2013  . COLONOSCOPY     Social History   Occupational History  . Not on file  Tobacco Use  . Smoking status: Never Smoker  . Smokeless tobacco: Never Used  Substance and Sexual Activity  . Alcohol use: No  . Drug use: No  . Sexual activity: Not on file

## 2017-06-07 ENCOUNTER — Other Ambulatory Visit (INDEPENDENT_AMBULATORY_CARE_PROVIDER_SITE_OTHER): Payer: Self-pay | Admitting: Surgery

## 2017-06-08 ENCOUNTER — Other Ambulatory Visit (INDEPENDENT_AMBULATORY_CARE_PROVIDER_SITE_OTHER): Payer: Self-pay | Admitting: Surgery

## 2017-06-11 ENCOUNTER — Ambulatory Visit
Admission: RE | Admit: 2017-06-11 | Discharge: 2017-06-11 | Disposition: A | Discharge status: Home / Self Care | Payer: BLUE CROSS/BLUE SHIELD | Source: Ambulatory Visit | Attending: Surgery | Admitting: Surgery

## 2017-06-11 DIAGNOSIS — M4316 Spondylolisthesis, lumbar region: Secondary | ICD-10-CM

## 2017-06-11 MED ORDER — GADOBENATE DIMEGLUMINE 529 MG/ML IV SOLN
18.0000 mL | Freq: Once | INTRAVENOUS | Status: AC | PRN
  Administered 2017-06-11: 18 mL via INTRAVENOUS

## 2017-06-19 ENCOUNTER — Ambulatory Visit (INDEPENDENT_AMBULATORY_CARE_PROVIDER_SITE_OTHER): Payer: BLUE CROSS/BLUE SHIELD | Admitting: Orthopaedic Surgery

## 2017-06-19 ENCOUNTER — Other Ambulatory Visit (INDEPENDENT_AMBULATORY_CARE_PROVIDER_SITE_OTHER): Payer: Self-pay

## 2017-06-19 DIAGNOSIS — M5441 Lumbago with sciatica, right side: Principal | ICD-10-CM

## 2017-06-19 DIAGNOSIS — M5126 Other intervertebral disc displacement, lumbar region: Secondary | ICD-10-CM | POA: Diagnosis not present

## 2017-06-19 DIAGNOSIS — Z981 Arthrodesis status: Secondary | ICD-10-CM | POA: Diagnosis not present

## 2017-06-19 DIAGNOSIS — G8929 Other chronic pain: Secondary | ICD-10-CM

## 2017-07-02 ENCOUNTER — Encounter (INDEPENDENT_AMBULATORY_CARE_PROVIDER_SITE_OTHER): Payer: Self-pay | Admitting: Orthopaedic Surgery

## 2017-07-02 DIAGNOSIS — M5126 Other intervertebral disc displacement, lumbar region: Secondary | ICD-10-CM | POA: Insufficient documentation

## 2017-07-02 DIAGNOSIS — Z981 Arthrodesis status: Secondary | ICD-10-CM | POA: Insufficient documentation

## 2017-07-02 NOTE — Progress Notes (Signed)
Office Visit Note   Patient: Annette Jefferson           Date of Birth: 08-23-1950           MRN: [id number] Visit Date: 06/19/2017              Requested by: No referring provider defined for this encounter. PCP: System, Pcp Not In   Assessment & Plan: Visit Diagnoses:  1. Protrusion of lumbar intervertebral disc   2. History of lumbar spinal fusion            At lumbar L4-5 2015.  Plan: Patient has broad-based disc protrusion at L3-4 with some foraminal narrowing above solid L4-5 fusion.  We will set her up for an epidural injection on the right side with Dr. Alvester Morin in office follow-up after injection.  Follow-Up Instructions: No follow-ups on file.   Orders:  No orders of the defined types were placed in this encounter.  No orders of the defined types were placed in this encounter.     Procedures: No procedures performed   Clinical Data: No additional findings.   Subjective: Chief Complaint  Patient presents with  . Lower Back - Follow-up    HPI 67 year old returns post MRI scan with worsening low back pain right side pain right lower extremity radicular symptoms.  Previous L4-5 fusion 2015.  Been seen since about 2 months postop and doing extremely well until March 2019 which started having progressive back pain right posterior hip pain radiating into her thigh down her knee sometimes down to her right foot.  No left leg symptoms.  MRI scan is available for review.  She been treated conservatively with a prednisone taper without improvement.  She also had Mobic and activity modification.  No associated bowel or bladder symptoms no fever or chills.  Review of Systems view of systems updated unchanged from 05/29/2017 office visit other than as mentioned in HPI.   Objective: Vital Signs: There were no vitals taken for this visit.  Physical Exam  Constitutional: She is oriented to person, place, and time. She appears well-developed.  HENT:  Head: Normocephalic.    Right Ear: External ear normal.  Left Ear: External ear normal.  Eyes: Pupils are equal, round, and reactive to light.  Neck: No tracheal deviation present. No thyromegaly present.  Cardiovascular: Normal rate.  Pulmonary/Chest: Effort normal.  Abdominal: Soft.  Neurological: She is alert and oriented to person, place, and time.  Skin: Skin is warm and dry.  Psychiatric: She has a normal mood and affect. Her behavior is normal.    Ortho Exam patient has some sciatic notch tenderness on the right side.  Negative on the left.  Bilateral SI joint tenderness.  Trochanteric bursal tenderness normal hip range of motion logroll.  No pitting edema distal pulses are palpable.  Specialty Comments:  No specialty comments available.  Imaging: CLINICAL DATA:  Right-sided low back pain for 3-4 weeks. No known injury.  EXAM: MRI LUMBAR SPINE WITHOUT AND WITH CONTRAST  TECHNIQUE: Multiplanar and multiecho pulse sequences of the lumbar spine were obtained without and with intravenous contrast.  CONTRAST:  18mL MULTIHANCE GADOBENATE DIMEGLUMINE 529 MG/ML IV SOLN  COMPARISON:  None.  FINDINGS: Segmentation:  Standard.  Alignment:  Physiologic.  Vertebrae:  No fracture, evidence of discitis, or bone lesion.  Conus medullaris and cauda equina: Conus extends to the T12 level. Conus and cauda equina appear normal.  Paraspinal and other soft tissues: No acute paraspinal abnormality.  Disc levels:  Disc spaces: Degenerative disc disease with disc desiccation and mild disc height loss at L2-3 and L3-4. Posterior lumbar interbody fusion at L4-5.  T11-12: Small left paracentral disc protrusion.  T12-L1: No significant disc bulge. No evidence of neural foraminal stenosis. No central canal stenosis.  L1-L2: Minimal broad-based disc bulge. No evidence of neural foraminal stenosis. No central canal stenosis.  L2-L3: Mild broad-based disc bulge. No evidence of neural  foraminal stenosis. No central canal stenosis.  L3-L4: Mild broad-based disc bulge. Mild bilateral foraminal stenosis. Mild spinal stenosis.  L4-L5: Interbody fusion. No evidence of neural foraminal stenosis. No central canal stenosis.  L5-S1: No significant disc bulge. No evidence of neural foraminal stenosis. No central canal stenosis. Moderate right facet arthropathy.  IMPRESSION: 1. Mild lumbar spine spondylosis as described above. 2. Posterior lumbar interbody fusion at L4-5 without failure or complication.   Electronically Signed   By: Elige Ko   On: 06/11/2017 12:24    PMFS History: Patient Active Problem List   Diagnosis Date Noted  . History of lumbar spinal fusion 07/02/2017  . Protrusion of lumbar intervertebral disc 07/02/2017  . Spinal stenosis of lumbar region 08/22/2013   Past Medical History:  Diagnosis Date  . Arthritis   . GERD (gastroesophageal reflux disease)   . Hyperlipemia   . Hypertension   . PONV (postoperative nausea and vomiting)    last time 2005    No family history on file.  Past Surgical History:  Procedure Laterality Date  . BREAST SURGERY Bilateral    cyst removed  . buniectomy Left 2005  . Buninectomy Right 2013  . COLONOSCOPY     Social History   Occupational History  . Not on file  Tobacco Use  . Smoking status: Never Smoker  . Smokeless tobacco: Never Used  Substance and Sexual Activity  . Alcohol use: No  . Drug use: No  . Sexual activity: Not on file

## 2017-07-04 ENCOUNTER — Ambulatory Visit (INDEPENDENT_AMBULATORY_CARE_PROVIDER_SITE_OTHER): Payer: BLUE CROSS/BLUE SHIELD

## 2017-07-04 ENCOUNTER — Ambulatory Visit (INDEPENDENT_AMBULATORY_CARE_PROVIDER_SITE_OTHER): Payer: BLUE CROSS/BLUE SHIELD | Admitting: Physical Medicine and Rehabilitation

## 2017-07-04 ENCOUNTER — Encounter (INDEPENDENT_AMBULATORY_CARE_PROVIDER_SITE_OTHER): Payer: Self-pay | Admitting: Physical Medicine and Rehabilitation

## 2017-07-04 VITALS — BP 183/93 | HR 81

## 2017-07-04 DIAGNOSIS — M5416 Radiculopathy, lumbar region: Secondary | ICD-10-CM | POA: Diagnosis not present

## 2017-07-04 MED ORDER — BETAMETHASONE SOD PHOS & ACET 6 (3-3) MG/ML IJ SUSP
12.0000 mg | Freq: Once | INTRAMUSCULAR | Status: AC
  Administered 2017-07-04: 12 mg

## 2017-07-04 NOTE — Patient Instructions (Signed)

## 2017-07-04 NOTE — Progress Notes (Signed)
 .  Numeric Pain Rating Scale and Functional Assessment Average Pain 6   In the last MONTH (on 0-10 scale) has pain interfered with the following?  1. General activity like being  able to carry out your everyday physical activities such as walking, climbing stairs, carrying groceries, or moving a chair?  Rating(4)   +Driver, -BT, -Dye Allergies.  

## 2017-07-16 NOTE — Procedures (Signed)
Lumbosacral Transforaminal Epidural Steroid Injection - Sub-Pedicular Approach with Fluoroscopic Guidance  Patient: Annette Jefferson      Date of Birth: [birth year] MRN: [id number] PCP: System, Pcp Not In      Visit Date: 07/04/2017   Universal Protocol:    Date/Time: 07/04/2017  Consent Given By: the patient  Position: PRONE  Additional Comments: Vital signs were monitored before and after the procedure. Patient was prepped and draped in the usual sterile fashion. The correct patient, procedure, and site was verified.   Injection Procedure Details:  Procedure Site One Meds Administered:  Meds ordered this encounter  Medications  . betamethasone acetate-betamethasone sodium phosphate (CELESTONE) injection 12 mg    Laterality: Right  Location/Site:  L3-L4  Needle size: 22 G  Needle type: Spinal  Needle Placement: Transforaminal  Findings:    -Comments: Excellent flow of contrast along the nerve and into the epidural space.  Procedure Details: After squaring off the end-plates to get a true AP view, the C-arm was positioned so that an oblique view of the foramen as noted above was visualized. The target area is just inferior to the "nose of the scotty dog" or sub pedicular. The soft tissues overlying this structure were infiltrated with 2-3 ml. of 1% Lidocaine without Epinephrine.  The spinal needle was inserted toward the target using a "trajectory" view along the fluoroscope beam.  Under AP and lateral visualization, the needle was advanced so it did not puncture dura and was located close the 6 O'Clock position of the pedical in AP tracterory. Biplanar projections were used to confirm position. Aspiration was confirmed to be negative for CSF and/or blood. A 1-2 ml. volume of Isovue-250 was injected and flow of contrast was noted at each level. Radiographs were obtained for documentation purposes.   After attaining the desired flow of contrast documented above, a 0.5 to  1.0 ml test dose of 0.25% Marcaine was injected into each respective transforaminal space.  The patient was observed for 90 seconds post injection.  After no sensory deficits were reported, and normal lower extremity motor function was noted,   the above injectate was administered so that equal amounts of the injectate were placed at each foramen (level) into the transforaminal epidural space.   Additional Comments:  The patient tolerated the procedure well Dressing: Band-Aid    Post-procedure details: Patient was observed during the procedure. Post-procedure instructions were reviewed.  Patient left the clinic in stable condition.

## 2017-07-16 NOTE — Progress Notes (Signed)
Annette Jefferson - 67 y.o. female MRN [id number]  Date of birth: 05-13-1950  Office Visit Note: Visit Date: 07/04/2017 PCP: System, Pcp Not In Referred by: No ref. provider found  Subjective: Chief Complaint  Patient presents with  . Lower Back - Pain  . Right Leg - Pain   HPI: [name] is a 67 year old female who comes in today at the request of Dr. Annell Greening for right L3 transforaminal epidural steroid injection.  She has been having chronic worsening severe at times bilateral low back pain with pain in the right buttocks that radiates into the right leg.  She reports this began in April 2019 and has not gotten better with rest or medication.  She reports worsening with standing for a long time.  If she has her feet elevated laying down it does seem to be better.  She rates her pain as a 6 out of 10.  MRI of the lumbar spine shows good fusion at L4-5 with some bulging at L3-4 but no focal compression.  She does have facet arthropathy and below the fusion particularly on the right.  We are going to complete a diagnostic and hopefully therapeutic right L3 transforaminal injection.  Depending on relief would  suggest looking at facet joint block on the right at L5-S1.   ROS Otherwise per HPI.  Assessment & Plan: Visit Diagnoses:  1. Lumbar radiculopathy     Plan: No additional findings.   Meds & Orders:  Meds ordered this encounter  Medications  . betamethasone acetate-betamethasone sodium phosphate (CELESTONE) injection 12 mg    Orders Placed This Encounter  Procedures  . XR C-ARM NO REPORT  . Epidural Steroid injection    Follow-up: Return in about 2 weeks (around 07/18/2017) for Dr. Ophelia Charter.   Procedures: No procedures performed  Lumbosacral Transforaminal Epidural Steroid Injection - Sub-Pedicular Approach with Fluoroscopic Guidance  Patient: Annette Jefferson      Date of Birth: May 19, 1950 MRN: [id number] PCP: System, Pcp Not In      Visit Date: 07/04/2017   Universal  Protocol:    Date/Time: 07/04/2017  Consent Given By: the patient  Position: PRONE  Additional Comments: Vital signs were monitored before and after the procedure. Patient was prepped and draped in the usual sterile fashion. The correct patient, procedure, and site was verified.   Injection Procedure Details:  Procedure Site One Meds Administered:  Meds ordered this encounter  Medications  . betamethasone acetate-betamethasone sodium phosphate (CELESTONE) injection 12 mg    Laterality: Right  Location/Site:  L3-L4  Needle size: 22 G  Needle type: Spinal  Needle Placement: Transforaminal  Findings:    -Comments: Excellent flow of contrast along the nerve and into the epidural space.  Procedure Details: After squaring off the end-plates to get a true AP view, the C-arm was positioned so that an oblique view of the foramen as noted above was visualized. The target area is just inferior to the "nose of the scotty dog" or sub pedicular. The soft tissues overlying this structure were infiltrated with 2-3 ml. of 1% Lidocaine without Epinephrine.  The spinal needle was inserted toward the target using a "trajectory" view along the fluoroscope beam.  Under AP and lateral visualization, the needle was advanced so it did not puncture dura and was located close the 6 O'Clock position of the pedical in AP tracterory. Biplanar projections were used to confirm position. Aspiration was confirmed to be negative for CSF and/or blood. A 1-2 ml. volume of Isovue-250  was injected and flow of contrast was noted at each level. Radiographs were obtained for documentation purposes.   After attaining the desired flow of contrast documented above, a 0.5 to 1.0 ml test dose of 0.25% Marcaine was injected into each respective transforaminal space.  The patient was observed for 90 seconds post injection.  After no sensory deficits were reported, and normal lower extremity motor function was noted,   the  above injectate was administered so that equal amounts of the injectate were placed at each foramen (level) into the transforaminal epidural space.   Additional Comments:  The patient tolerated the procedure well Dressing: Band-Aid    Post-procedure details: Patient was observed during the procedure. Post-procedure instructions were reviewed.  Patient left the clinic in stable condition.    Clinical History: MRI LUMBAR SPINE WITHOUT AND WITH CONTRAST  TECHNIQUE: Multiplanar and multiecho pulse sequences of the lumbar spine were obtained without and with intravenous contrast.  CONTRAST:  18mL MULTIHANCE GADOBENATE DIMEGLUMINE 529 MG/ML IV SOLN  COMPARISON:  None.  FINDINGS: Segmentation:  Standard.  Alignment:  Physiologic.  Vertebrae:  No fracture, evidence of discitis, or bone lesion.  Conus medullaris and cauda equina: Conus extends to the T12 level. Conus and cauda equina appear normal.  Paraspinal and other soft tissues: No acute paraspinal abnormality.  Disc levels:  Disc spaces: Degenerative disc disease with disc desiccation and mild disc height loss at L2-3 and L3-4. Posterior lumbar interbody fusion at L4-5.  T11-12: Small left paracentral disc protrusion.  T12-L1: No significant disc bulge. No evidence of neural foraminal stenosis. No central canal stenosis.  L1-L2: Minimal broad-based disc bulge. No evidence of neural foraminal stenosis. No central canal stenosis.  L2-L3: Mild broad-based disc bulge. No evidence of neural foraminal stenosis. No central canal stenosis.  L3-L4: Mild broad-based disc bulge. Mild bilateral foraminal stenosis. Mild spinal stenosis.  L4-L5: Interbody fusion. No evidence of neural foraminal stenosis. No central canal stenosis.  L5-S1: No significant disc bulge. No evidence of neural foraminal stenosis. No central canal stenosis. Moderate right facet arthropathy.  IMPRESSION: 1. Mild lumbar spine  spondylosis as described above. 2. Posterior lumbar interbody fusion at L4-5 without failure or complication.   Electronically Signed   By: Elige KoHetal  Patel   On: 06/11/2017 12:24   She reports that she has never smoked. She has never used smokeless tobacco. No results for input(s): HGBA1C, LABURIC in the last 8760 hours.  Objective:  VS:  HT:    WT:   BMI:     BP:(!) 183/93  HR:81bpm  TEMP: ( )  RESP:  Physical Exam  Ortho Exam Imaging: No results found.  Past Medical/Family/Surgical/Social History: Medications & Allergies reviewed per EMR, new medications updated. Patient Active Problem List   Diagnosis Date Noted  . History of lumbar spinal fusion 07/02/2017  . Protrusion of lumbar intervertebral disc 07/02/2017  . Spinal stenosis of lumbar region 08/22/2013   Past Medical History:  Diagnosis Date  . Arthritis   . GERD (gastroesophageal reflux disease)   . Hyperlipemia   . Hypertension   . PONV (postoperative nausea and vomiting)    last time 2005   History reviewed. No pertinent family history. Past Surgical History:  Procedure Laterality Date  . BREAST SURGERY Bilateral    cyst removed  . buniectomy Left 2005  . Buninectomy Right 2013  . COLONOSCOPY     Social History   Occupational History  . Not on file  Tobacco Use  . Smoking status:  Never Smoker  . Smokeless tobacco: Never Used  Substance and Sexual Activity  . Alcohol use: No  . Drug use: No  . Sexual activity: Not on file

## 2017-07-31 ENCOUNTER — Encounter (INDEPENDENT_AMBULATORY_CARE_PROVIDER_SITE_OTHER): Payer: Self-pay | Admitting: Orthopaedic Surgery

## 2017-07-31 ENCOUNTER — Ambulatory Visit (INDEPENDENT_AMBULATORY_CARE_PROVIDER_SITE_OTHER): Payer: Medicare HMO | Admitting: Orthopaedic Surgery

## 2017-07-31 VITALS — BP 190/88 | HR 50 | Ht 66.0 in | Wt 190.0 lb

## 2017-07-31 DIAGNOSIS — Z981 Arthrodesis status: Secondary | ICD-10-CM | POA: Diagnosis not present

## 2017-07-31 DIAGNOSIS — M5126 Other intervertebral disc displacement, lumbar region: Secondary | ICD-10-CM
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
## 2017-10-31 ENCOUNTER — Emergency Department (HOSPITAL_BASED_OUTPATIENT_CLINIC_OR_DEPARTMENT_OTHER)
Admission: EM | Admit: 2017-10-31 | Discharge: 2017-10-31 | Disposition: A | Discharge status: Home / Self Care | Payer: BLUE CROSS/BLUE SHIELD | Attending: Emergency Medicine | Admitting: Emergency Medicine

## 2017-10-31 ENCOUNTER — Other Ambulatory Visit: Payer: Self-pay

## 2017-10-31 ENCOUNTER — Encounter (HOSPITAL_BASED_OUTPATIENT_CLINIC_OR_DEPARTMENT_OTHER): Payer: Self-pay

## 2017-10-31 DIAGNOSIS — Y9389 Activity, other specified: Secondary | ICD-10-CM | POA: Diagnosis not present

## 2017-10-31 DIAGNOSIS — Y92410 Unspecified street and highway as the place of occurrence of the external cause: Secondary | ICD-10-CM | POA: Insufficient documentation

## 2017-10-31 DIAGNOSIS — M25511 Pain in right shoulder: Secondary | ICD-10-CM | POA: Diagnosis present

## 2017-10-31 DIAGNOSIS — Z79899 Other long term (current) drug therapy: Secondary | ICD-10-CM | POA: Diagnosis not present

## 2017-10-31 DIAGNOSIS — I10 Essential (primary) hypertension: Secondary | ICD-10-CM | POA: Diagnosis not present

## 2017-10-31 DIAGNOSIS — Y998 Other external cause status: Secondary | ICD-10-CM | POA: Diagnosis not present

## 2017-10-31 NOTE — Discharge Instructions (Signed)
Take tylenol for soreness. Return if any problems.
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
## 2017-10-31 NOTE — ED Notes (Signed)
Pt d/c home with family. Ambulatory to d/c window with steady gait 
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
## 2017-11-13 ENCOUNTER — Ambulatory Visit (INDEPENDENT_AMBULATORY_CARE_PROVIDER_SITE_OTHER): Payer: BLUE CROSS/BLUE SHIELD | Admitting: Orthopaedic Surgery

## 2017-11-13 ENCOUNTER — Ambulatory Visit (INDEPENDENT_AMBULATORY_CARE_PROVIDER_SITE_OTHER): Payer: BLUE CROSS/BLUE SHIELD

## 2017-11-13 ENCOUNTER — Encounter (INDEPENDENT_AMBULATORY_CARE_PROVIDER_SITE_OTHER): Payer: Self-pay | Admitting: Orthopaedic Surgery

## 2017-11-13 VITALS — BP 197/95 | Ht 65.0 in | Wt 175.0 lb

## 2017-11-13 DIAGNOSIS — M545 Low back pain, unspecified: Secondary | ICD-10-CM
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
## 2018-01-11 ENCOUNTER — Ambulatory Visit (INDEPENDENT_AMBULATORY_CARE_PROVIDER_SITE_OTHER): Payer: BLUE CROSS/BLUE SHIELD | Admitting: Orthopaedic Surgery

## 2018-06-26 ENCOUNTER — Ambulatory Visit: Payer: BLUE CROSS/BLUE SHIELD | Admitting: Orthopaedic Surgery

## 2018-06-26 ENCOUNTER — Encounter: Payer: Self-pay | Admitting: Orthopaedic Surgery

## 2018-06-26 ENCOUNTER — Other Ambulatory Visit: Payer: Self-pay

## 2018-06-26 ENCOUNTER — Ambulatory Visit: Payer: BLUE CROSS/BLUE SHIELD

## 2018-06-26 VITALS — Ht 65.0 in | Wt 180.0 lb

## 2018-06-26 DIAGNOSIS — Z981 Arthrodesis status: Secondary | ICD-10-CM

## 2018-06-26 DIAGNOSIS — M5126 Other intervertebral disc displacement, lumbar region: Secondary | ICD-10-CM | POA: Diagnosis not present

## 2018-06-26 DIAGNOSIS — M545 Low back pain, unspecified: Secondary | ICD-10-CM

## 2018-06-26 NOTE — Progress Notes (Signed)
Office Visit Note   Patient: Annette Jefferson           Date of Birth: 1950/07/14           MRN: [id number] Visit Date: 06/26/2018              Requested by: Clide DalesWright, Morgan Dionne, PA [address] [place], [place] [id number] PCP: Clide DalesWright, Morgan Dionne, PA   Assessment & Plan: Visit Diagnoses:  1. Acute right-sided low back pain, unspecified whether sciatica present   2. History of lumbar spinal fusion   3. Protrusion of lumbar intervertebral disc     Plan: We will set patient up for repeat epidural transforaminal on the right due to adjacent level degenerative changes at L3-4 above her solid L4-5 fusion.  Previously she got more than 4 months relief from a single injection.  She can follow-up with me post injection if she is having continued problems.  X-rays and previous MRI scan as well as MRI report was reviewed with patient in detail and pathophysiology discussed.  Follow-Up Instructions: Return if symptoms worsen or fail to improve.   Orders:  Orders Placed This Encounter  Procedures  . XR Lumbar Spine 2-3 Views  . Ambulatory referral to Physical Medicine Rehab   No orders of the defined types were placed in this encounter.     Procedures: No procedures performed   Clinical Data: No additional findings.   Subjective: Chief Complaint  Patient presents with  . Lower Back - Pain    HPI 68 year old female returns and states she stumbled recently her son helped catch her but she did not actually fall is had some increased back pain since that time with pain down the back of her right leg down to her heel.  No symptoms on the left side she is at trouble sleeping.  She is used Mobic and arthritic cream.  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  MRI scan 2019 which was 4 years postop showed some mild broad-based disc bulge and bilateral foraminal narrowing mild stenosis at L3-4.  L5-S1 just showed some  facet arthropathy.  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  She also does some upholstery sewing at home.  No fever chills no bowel bladder symptoms no problems with her lumbar incision.  She is not noticed any weakness in her foot.  Patient got good relief for several months post L3 right foraminal epidural in May 2019 by Dr. Alvester MorinNewton.  Review of Systems L4-5 fusion 2015.  Negative cardiovascular respiratory GI GU.  She does take cholesterol medication.  14 point systems otherwise negative.   Objective: Vital Signs: Ht 5\' 5"  (1.651 m)   Wt 180 lb (81.6 kg)   BMI 29.95 kg/m   Physical Exam Constitutional:      Appearance: She is well-developed.  HENT:     Head: Normocephalic.     Right Ear: External ear normal.     Left Ear: External ear normal.  Eyes:     Pupils: Pupils are equal, round, and reactive to light.  Neck:     Thyroid: No thyromegaly.     Trachea: No tracheal deviation.  Cardiovascular:     Rate and Rhythm: Normal rate.  Pulmonary:     Effort: Pulmonary effort is normal.  Abdominal:  Palpations: Abdomen is soft.  Skin:    General: Skin is warm and dry.  Neurological:     Mental Status: She is alert and oriented to person, place, and time.  Psychiatric:        Behavior: Behavior normal.     Ortho Exam patient has some with palpation lumbar spine.  Minimal sciatic notch tenderness negative straight leg raising 90 degrees.  Knee and ankle jerk are 2+ and symmetrical.  Anterior tib gastrocsoleus quads are strong.  Lumbar incisions well-healed.  Distal pulses are intact.  Specialty Comments:  No specialty comments available.  Imaging: CLINICAL DATA:  Right-sided low back pain for 3-4 weeks. No known injury.  EXAM: MRI LUMBAR SPINE WITHOUT AND WITH CONTRAST  TECHNIQUE: Multiplanar and multiecho pulse sequences of the lumbar  spine were obtained without and with intravenous contrast.  CONTRAST:  26mL MULTIHANCE GADOBENATE DIMEGLUMINE 529 MG/ML IV SOLN  COMPARISON:  None.  FINDINGS: Segmentation:  Standard.  Alignment:  Physiologic.  Vertebrae:  No fracture, evidence of discitis, or bone lesion.  Conus medullaris and cauda equina: Conus extends to the T12 level. Conus and cauda equina appear normal.  Paraspinal and other soft tissues: No acute paraspinal abnormality.  Disc levels:  Disc spaces: Degenerative disc disease with disc desiccation and mild disc height loss at L2-3 and L3-4. Posterior lumbar interbody fusion at L4-5.  T11-12: Small left paracentral disc protrusion.  T12-L1: No significant disc bulge. No evidence of neural foraminal stenosis. No central canal stenosis.  L1-L2: Minimal broad-based disc bulge. No evidence of neural foraminal stenosis. No central canal stenosis.  L2-L3: Mild broad-based disc bulge. No evidence of neural foraminal stenosis. No central canal stenosis.  L3-L4: Mild broad-based disc bulge. Mild bilateral foraminal stenosis. Mild spinal stenosis.  L4-L5: Interbody fusion. No evidence of neural foraminal stenosis. No central canal stenosis.  L5-S1: No significant disc bulge. No evidence of neural foraminal stenosis. No central canal stenosis. Moderate right facet arthropathy.  IMPRESSION: 1. Mild lumbar spine spondylosis as described above. 2. Posterior lumbar interbody fusion at L4-5 without failure or complication.   Electronically Signed   By: Elige Ko   On: 06/11/2017 12:24   PMFS History: Patient Active Problem List   Diagnosis Date Noted  . History of lumbar spinal fusion 07/02/2017  . Protrusion of lumbar intervertebral disc 07/02/2017  . Spinal stenosis of lumbar region 08/22/2013   Past Medical History:  Diagnosis Date  . Arthritis   . GERD (gastroesophageal reflux disease)   . Hyperlipemia   .  Hypertension   . PONV (postoperative nausea and vomiting)    last time 2005    No family history on file.  Past Surgical History:  Procedure Laterality Date  . BREAST SURGERY Bilateral    cyst removed  . buniectomy Left 2005  . Buninectomy Right 2013  . COLONOSCOPY     Social History   Occupational History  . Not on file  Tobacco Use  . Smoking status: Never Smoker  . Smokeless tobacco: Never Used  Substance and Sexual Activity  . Alcohol use: No  . Drug use: No  . Sexual activity: Not on file

## 2018-07-06 ENCOUNTER — Telehealth: Payer: Self-pay | Admitting: Internal Medicine

## 2018-07-06 NOTE — Telephone Encounter (Signed)
Attempted to call patient, no answer. Left message for patient to call back at [phone].

## 2018-07-22 ENCOUNTER — Other Ambulatory Visit: Payer: Self-pay

## 2018-07-22 ENCOUNTER — Encounter: Payer: Self-pay | Admitting: Physical Medicine and Rehabilitation

## 2018-07-22 ENCOUNTER — Ambulatory Visit (INDEPENDENT_AMBULATORY_CARE_PROVIDER_SITE_OTHER): Payer: BC Managed Care – PPO | Admitting: Physical Medicine and Rehabilitation

## 2018-07-22 ENCOUNTER — Ambulatory Visit: Payer: Self-pay

## 2018-07-22 VITALS — BP 182/83 | HR 82

## 2018-07-22 DIAGNOSIS — M961 Postlaminectomy syndrome, not elsewhere classified: Secondary | ICD-10-CM | POA: Diagnosis not present

## 2018-07-22 DIAGNOSIS — M5416 Radiculopathy, lumbar region: Secondary | ICD-10-CM

## 2018-07-22 MED ORDER — BETAMETHASONE SOD PHOS & ACET 6 (3-3) MG/ML IJ SUSP
12.0000 mg | Freq: Once | INTRAMUSCULAR | Status: AC
  Administered 2018-07-22: 12 mg

## 2018-07-22 NOTE — Progress Notes (Signed)
 .  Numeric Pain Rating Scale and Functional Assessment Average Pain 8   In the last MONTH (on 0-10 scale) has pain interfered with the following?  1. General activity like being  able to carry out your everyday physical activities such as walking, climbing stairs, carrying groceries, or moving a chair?  Rating(7)   +Driver, -BT, -Dye Allergies.  

## 2018-09-06 NOTE — Procedures (Signed)
Lumbosacral Transforaminal Epidural Steroid Injection - Sub-Pedicular Approach with Fluoroscopic Guidance  Patient: Annette Jefferson      Date of Birth: November 11, 1950 MRN: [id number] PCP: Fortino Sic, PA      Visit Date: 07/22/2018   Universal Protocol:    Date/Time: 07/22/2018  Consent Given By: the patient  Position: PRONE  Additional Comments: Vital signs were monitored before and after the procedure. Patient was prepped and draped in the usual sterile fashion. The correct patient, procedure, and site was verified.   Injection Procedure Details:  Procedure Site One Meds Administered:  Meds ordered this encounter  Medications  . betamethasone acetate-betamethasone sodium phosphate (CELESTONE) injection 12 mg    Laterality: Right  Location/Site:  L3-L4  Needle size: 22 G  Needle type: Spinal  Needle Placement: Transforaminal  Findings:    -Comments: Excellent flow of contrast along the nerve and into the epidural space.  Procedure Details: After squaring off the end-plates to get a true AP view, the C-arm was positioned so that an oblique view of the foramen as noted above was visualized. The target area is just inferior to the "nose of the scotty dog" or sub pedicular. The soft tissues overlying this structure were infiltrated with 2-3 ml. of 1% Lidocaine without Epinephrine.  The spinal needle was inserted toward the target using a "trajectory" view along the fluoroscope beam.  Under AP and lateral visualization, the needle was advanced so it did not puncture dura and was located close the 6 O'Clock position of the pedical in AP tracterory. Biplanar projections were used to confirm position. Aspiration was confirmed to be negative for CSF and/or blood. A 1-2 ml. volume of Isovue-250 was injected and flow of contrast was noted at each level. Radiographs were obtained for documentation purposes.   After attaining the desired flow of contrast documented above, a  0.5 to 1.0 ml test dose of 0.25% Marcaine was injected into each respective transforaminal space.  The patient was observed for 90 seconds post injection.  After no sensory deficits were reported, and normal lower extremity motor function was noted,   the above injectate was administered so that equal amounts of the injectate were placed at each foramen (level) into the transforaminal epidural space.   Additional Comments:  The patient tolerated the procedure well Dressing: 2 x 2 sterile gauze and Band-Aid    Post-procedure details: Patient was observed during the procedure. Post-procedure instructions were reviewed.  Patient left the clinic in stable condition.

## 2018-09-06 NOTE — Progress Notes (Signed)
Annette FellingLillie Jefferson - [age] y.o. female MRN [id number]  Date of birth: 1950/10/07  Office Visit Note: Visit Date: 07/22/2018 PCP: Clide DalesWright, Morgan Dionne, PA Referred by: Clide DalesWright, Morgan Dionne, *  Subjective: Chief Complaint  Patient presents with  . Lower Back - Pain  . Right Leg - Pain   HPI:  Annette Jefferson is a 68 y.o. female who comes in today At the request of Dr. Annell GreeningMark Yates for right L3 transforaminal epidural steroid injection above the level of fusion.  Patient has 1 level instrumented fusion at L4-5.  MRI reviewed below.  She is having pain that has not been resolved with time and medication management and conservative care.  ROS Otherwise per HPI.  Assessment & Plan: Visit Diagnoses:  1. Lumbar radiculopathy   2. Post laminectomy syndrome     Plan: No additional findings.   Meds & Orders:  Meds ordered this encounter  Medications  . betamethasone acetate-betamethasone sodium phosphate (CELESTONE) injection 12 mg    Orders Placed This Encounter  Procedures  . XR C-ARM NO REPORT  . Epidural Steroid injection    Follow-up: Return if symptoms worsen or fail to improve, for Annell GreeningMark Yates, MD.   Procedures: No procedures performed  Lumbosacral Transforaminal Epidural Steroid Injection - Sub-Pedicular Approach with Fluoroscopic Guidance  Patient: Annette Jefferson      Date of Birth: 1950/10/07 MRN: [id number] PCP: Clide DalesWright, Morgan Dionne, PA      Visit Date: 07/22/2018   Universal Protocol:    Date/Time: 07/22/2018  Consent Given By: the patient  Position: PRONE  Additional Comments: Vital signs were monitored before and after the procedure. Patient was prepped and draped in the usual sterile fashion. The correct patient, procedure, and site was verified.   Injection Procedure Details:  Procedure Site One Meds Administered:  Meds ordered this encounter  Medications  . betamethasone acetate-betamethasone sodium phosphate (CELESTONE) injection 12 mg     Laterality: Right  Location/Site:  L3-L4  Needle size: 22 G  Needle type: Spinal  Needle Placement: Transforaminal  Findings:    -Comments: Excellent flow of contrast along the nerve and into the epidural space.  Procedure Details: After squaring off the end-plates to get a true AP view, the C-arm was positioned so that an oblique view of the foramen as noted above was visualized. The target area is just inferior to the "nose of the scotty dog" or sub pedicular. The soft tissues overlying this structure were infiltrated with 2-3 ml. of 1% Lidocaine without Epinephrine.  The spinal needle was inserted toward the target using a "trajectory" view along the fluoroscope beam.  Under AP and lateral visualization, the needle was advanced so it did not puncture dura and was located close the 6 O'Clock position of the pedical in AP tracterory. Biplanar projections were used to confirm position. Aspiration was confirmed to be negative for CSF and/or blood. A 1-2 ml. volume of Isovue-250 was injected and flow of contrast was noted at each level. Radiographs were obtained for documentation purposes.   After attaining the desired flow of contrast documented above, a 0.5 to 1.0 ml test dose of 0.25% Marcaine was injected into each respective transforaminal space.  The patient was observed for 90 seconds post injection.  After no sensory deficits were reported, and normal lower extremity motor function was noted,   the above injectate was administered so that equal amounts of the injectate were placed at each foramen (level) into the transforaminal epidural space.   Additional Comments:  The patient tolerated the procedure well Dressing: 2 x 2 sterile gauze and Band-Aid    Post-procedure details: Patient was observed during the procedure. Post-procedure instructions were reviewed.  Patient left the clinic in stable condition.    Clinical History: MRI LUMBAR SPINE WITHOUT AND WITH CONTRAST   TECHNIQUE: Multiplanar and multiecho pulse sequences of the lumbar spine were obtained without and with intravenous contrast.  CONTRAST:  49mL MULTIHANCE GADOBENATE DIMEGLUMINE 529 MG/ML IV SOLN  COMPARISON:  None.  FINDINGS: Segmentation:  Standard.  Alignment:  Physiologic.  Vertebrae:  No fracture, evidence of discitis, or bone lesion.  Conus medullaris and cauda equina: Conus extends to the T12 level. Conus and cauda equina appear normal.  Paraspinal and other soft tissues: No acute paraspinal abnormality.  Disc levels:  Disc spaces: Degenerative disc disease with disc desiccation and mild disc height loss at L2-3 and L3-4. Posterior lumbar interbody fusion at L4-5.  T11-12: Small left paracentral disc protrusion.  T12-L1: No significant disc bulge. No evidence of neural foraminal stenosis. No central canal stenosis.  L1-L2: Minimal broad-based disc bulge. No evidence of neural foraminal stenosis. No central canal stenosis.  L2-L3: Mild broad-based disc bulge. No evidence of neural foraminal stenosis. No central canal stenosis.  L3-L4: Mild broad-based disc bulge. Mild bilateral foraminal stenosis. Mild spinal stenosis.  L4-L5: Interbody fusion. No evidence of neural foraminal stenosis. No central canal stenosis.  L5-S1: No significant disc bulge. No evidence of neural foraminal stenosis. No central canal stenosis. Moderate right facet arthropathy.  IMPRESSION: 1. Mild lumbar spine spondylosis as described above. 2. Posterior lumbar interbody fusion at L4-5 without failure or complication.   Electronically Signed   By: Kathreen Devoid   On: 06/11/2017 12:24     Objective:  VS:  HT:    WT:   BMI:     BP:(!) 182/83  HR:82bpm  TEMP: ( )  RESP:  Physical Exam  Ortho Exam Imaging: No results found.

## 2019-05-08 ENCOUNTER — Telehealth: Payer: Self-pay | Admitting: Physical Medicine and Rehabilitation

## 2019-05-08 NOTE — Telephone Encounter (Signed)
Spoke with Darreld Mclean from pt insurance and he stated no pa is needed for [id number].  Reference# [id number]

## 2019-05-08 NOTE — Telephone Encounter (Signed)
Is auth needed for [id number]? Scheduled for 4/22 with driver.

## 2019-05-08 NOTE — Telephone Encounter (Signed)
ok 

## 2019-05-08 NOTE — Telephone Encounter (Signed)
Patient states that she is having hip pain going down to her knee. She reports that this is the same pain she had before her last injection- right L3 TF 07/22/2018. She reports that she had good relief from the injection. Ok to repeat?

## 2019-05-29 ENCOUNTER — Other Ambulatory Visit: Payer: Self-pay

## 2019-05-29 ENCOUNTER — Ambulatory Visit (INDEPENDENT_AMBULATORY_CARE_PROVIDER_SITE_OTHER): Payer: BC Managed Care – PPO | Admitting: Physical Medicine and Rehabilitation

## 2019-05-29 ENCOUNTER — Encounter: Payer: Self-pay | Admitting: Physical Medicine and Rehabilitation

## 2019-05-29 ENCOUNTER — Ambulatory Visit: Payer: Self-pay

## 2019-05-29 VITALS — BP 195/89 | HR 70

## 2019-05-29 DIAGNOSIS — M961 Postlaminectomy syndrome, not elsewhere classified: Secondary | ICD-10-CM

## 2019-05-29 DIAGNOSIS — M545 Low back pain, unspecified: Secondary | ICD-10-CM

## 2019-05-29 DIAGNOSIS — G8929 Other chronic pain: Secondary | ICD-10-CM

## 2019-05-29 DIAGNOSIS — M5416 Radiculopathy, lumbar region: Secondary | ICD-10-CM

## 2019-05-29 DIAGNOSIS — M47816 Spondylosis without myelopathy or radiculopathy, lumbar region: Secondary | ICD-10-CM

## 2019-05-29 MED ORDER — DEXAMETHASONE SODIUM PHOSPHATE 10 MG/ML IJ SOLN
15.0000 mg | Freq: Once | INTRAMUSCULAR | Status: AC
  Administered 2019-05-29: 16:00:00 15 mg

## 2019-05-29 NOTE — Progress Notes (Signed)
 .  Numeric Pain Rating Scale and Functional Assessment Average Pain 8   In the last MONTH (on 0-10 scale) has pain interfered with the following?  1. General activity like being  able to carry out your everyday physical activities such as walking, climbing stairs, carrying groceries, or moving a chair?  Rating(8)   +Driver, -BT, -Dye Allergies.  

## 2019-06-02 ENCOUNTER — Encounter: Payer: Self-pay | Admitting: Physical Medicine and Rehabilitation

## 2019-06-02 NOTE — Procedures (Signed)
Lumbosacral Transforaminal Epidural Steroid Injection - Sub-Pedicular Approach with Fluoroscopic Guidance  Patient: Annette Jefferson      Date of Birth: 1950/05/19 MRN: [id number] PCP: Clide Dales, PA      Visit Date: 05/29/2019   Universal Protocol:    Date/Time: 05/29/2019  Consent Given By: the patient  Position: PRONE  Additional Comments: Vital signs were monitored before and after the procedure. Patient was prepped and draped in the usual sterile fashion. The correct patient, procedure, and site was verified.   Injection Procedure Details:  Procedure Site One Meds Administered:  Meds ordered this encounter  Medications  . dexamethasone (DECADRON) injection 15 mg    Laterality: Right  Location/Site:  L3-L4  Needle size: 22 G  Needle type: Spinal  Needle Placement: Transforaminal  Findings:    -Comments: Excellent flow of contrast along the nerve and into the epidural space.  Procedure Details: After squaring off the end-plates to get a true AP view, the C-arm was positioned so that an oblique view of the foramen as noted above was visualized. The target area is just inferior to the "nose of the scotty dog" or sub pedicular. The soft tissues overlying this structure were infiltrated with 2-3 ml. of 1% Lidocaine without Epinephrine.  The spinal needle was inserted toward the target using a "trajectory" view along the fluoroscope beam.  Under AP and lateral visualization, the needle was advanced so it did not puncture dura and was located close the 6 O'Clock position of the pedical in AP tracterory. Biplanar projections were used to confirm position. Aspiration was confirmed to be negative for CSF and/or blood. A 1-2 ml. volume of Isovue-250 was injected and flow of contrast was noted at each level. Radiographs were obtained for documentation purposes.   After attaining the desired flow of contrast documented above, a 0.5 to 1.0 ml test dose of 0.25% Marcaine  was injected into each respective transforaminal space.  The patient was observed for 90 seconds post injection.  After no sensory deficits were reported, and normal lower extremity motor function was noted,   the above injectate was administered so that equal amounts of the injectate were placed at each foramen (level) into the transforaminal epidural space.   Additional Comments:  The patient tolerated the procedure well Dressing: 2 x 2 sterile gauze and Band-Aid    Post-procedure details: Patient was observed during the procedure. Post-procedure instructions were reviewed.  Patient left the clinic in stable condition.

## 2019-06-02 NOTE — Progress Notes (Signed)
Annette Jefferson - 69 y.o. female MRN [id number]  Date of birth: September 07, 1950  Office Visit Note: Visit Date: 05/29/2019 PCP: Fortino Sic, PA Referred by: Fortino Sic, *  Subjective: Chief Complaint  Patient presents with  . Lower Back - Pain   HPI: Annette Jefferson is a 69 y.o. female who comes in today For reevaluation and management of chronic worsening right-sided low back pain with some referral to the thigh.  Dermatome more related maybe to an L5 dermatome.  She has a history of prior lumbar fusion at L4-5.  I have seen her on 2 prior occasions back in 2019 and 2020 and completed L3 transforaminal injection both times with good relief of her symptoms.  She comes in today without any new injury.  Complaints are similar.  She has some pain into the leg without paresthesia.  She rates her pain as an 8 out of 10 and it was doing much better in the last few months.  She does take some methocarbamol.  She does use heat and ice.  She still tries to stay active and really wants to stay active if she can.  Prior MRI did not show any severe adjacent level disease although there was some foraminal narrowing at L3 and facet arthropathy at L5.  She rates her pain is quite severe with exacerbation of this chronic condition.  She has been unable to take any sort of pain medication in the past Duda intolerances to both codeine and tramadol.  Review of Systems  Musculoskeletal: Positive for back pain and joint pain.  All other systems reviewed and are negative.  Otherwise per HPI.  Assessment & Plan: Visit Diagnoses:  1. Lumbar radiculopathy   2. Post laminectomy syndrome   3. Spondylosis without myelopathy or radiculopathy, lumbar region   4. Chronic bilateral low back pain without sciatica     Plan: Findings:  Chronic history of back pain status post lumbar fusion at L4-5 with some milder adjacent level disease with L3 foraminal narrowing mild on the right and facet arthropathy  moderate on the right at L5-S1.  Historically L3 injection has done really well for her and we decided to complete that today because has done so well in the past.  I think some of her symptoms may be coming from the facet arthropathy below the level of fusion.  Her MRI is still fairly current from 2019 and this is not really anything different from a pain standpoint overall other than exacerbation in severity.  No red flag complaints on exam or clinically.  Depending on relief would look at facet joint block and potential radiofrequency ablation at L5-S1.  Ultimately could consider spinal cord stimulator trial if this became more of a chronic issue but so far she has done well with intermittent injection in the past.  She will continue with current home exercises and.    Meds & Orders:  Meds ordered this encounter  Medications  . dexamethasone (DECADRON) injection 15 mg    Orders Placed This Encounter  Procedures  . XR C-ARM NO REPORT  . Epidural Steroid injection    Follow-up: Return if symptoms worsen or fail to improve.   Procedures: No procedures performed  Lumbosacral Transforaminal Epidural Steroid Injection - Sub-Pedicular Approach with Fluoroscopic Guidance  Patient: Annette Jefferson      Date of Birth: Dec 11, 1950 MRN: [id number] PCP: Fortino Sic, PA      Visit Date: 05/29/2019   Universal Protocol:    Date/Time:  05/29/2019  Consent Given By: the patient  Position: PRONE  Additional Comments: Vital signs were monitored before and after the procedure. Patient was prepped and draped in the usual sterile fashion. The correct patient, procedure, and site was verified.   Injection Procedure Details:  Procedure Site One Meds Administered:  Meds ordered this encounter  Medications  . dexamethasone (DECADRON) injection 15 mg    Laterality: Right  Location/Site:  L3-L4  Needle size: 22 G  Needle type: Spinal  Needle Placement:  Transforaminal  Findings:    -Comments: Excellent flow of contrast along the nerve and into the epidural space.  Procedure Details: After squaring off the end-plates to get a true AP view, the C-arm was positioned so that an oblique view of the foramen as noted above was visualized. The target area is just inferior to the "nose of the scotty dog" or sub pedicular. The soft tissues overlying this structure were infiltrated with 2-3 ml. of 1% Lidocaine without Epinephrine.  The spinal needle was inserted toward the target using a "trajectory" view along the fluoroscope beam.  Under AP and lateral visualization, the needle was advanced so it did not puncture dura and was located close the 6 O'Clock position of the pedical in AP tracterory. Biplanar projections were used to confirm position. Aspiration was confirmed to be negative for CSF and/or blood. A 1-2 ml. volume of Isovue-250 was injected and flow of contrast was noted at each level. Radiographs were obtained for documentation purposes.   After attaining the desired flow of contrast documented above, a 0.5 to 1.0 ml test dose of 0.25% Marcaine was injected into each respective transforaminal space.  The patient was observed for 90 seconds post injection.  After no sensory deficits were reported, and normal lower extremity motor function was noted,   the above injectate was administered so that equal amounts of the injectate were placed at each foramen (level) into the transforaminal epidural space.   Additional Comments:  The patient tolerated the procedure well Dressing: 2 x 2 sterile gauze and Band-Aid    Post-procedure details: Patient was observed during the procedure. Post-procedure instructions were reviewed.  Patient left the clinic in stable condition.     Clinical History: MRI LUMBAR SPINE WITHOUT AND WITH CONTRAST  TECHNIQUE: Multiplanar and multiecho pulse sequences of the lumbar spine were obtained without and with  intravenous contrast.  CONTRAST:  35mL MULTIHANCE GADOBENATE DIMEGLUMINE 529 MG/ML IV SOLN  COMPARISON:  None.  FINDINGS: Segmentation:  Standard.  Alignment:  Physiologic.  Vertebrae:  No fracture, evidence of discitis, or bone lesion.  Conus medullaris and cauda equina: Conus extends to the T12 level. Conus and cauda equina appear normal.  Paraspinal and other soft tissues: No acute paraspinal abnormality.  Disc levels:  Disc spaces: Degenerative disc disease with disc desiccation and mild disc height loss at L2-3 and L3-4. Posterior lumbar interbody fusion at L4-5.  T11-12: Small left paracentral disc protrusion.  T12-L1: No significant disc bulge. No evidence of neural foraminal stenosis. No central canal stenosis.  L1-L2: Minimal broad-based disc bulge. No evidence of neural foraminal stenosis. No central canal stenosis.  L2-L3: Mild broad-based disc bulge. No evidence of neural foraminal stenosis. No central canal stenosis.  L3-L4: Mild broad-based disc bulge. Mild bilateral foraminal stenosis. Mild spinal stenosis.  L4-L5: Interbody fusion. No evidence of neural foraminal stenosis. No central canal stenosis.  L5-S1: No significant disc bulge. No evidence of neural foraminal stenosis. No central canal stenosis. Moderate right facet arthropathy.  IMPRESSION: 1. Mild lumbar spine spondylosis as described above. 2. Posterior lumbar interbody fusion at L4-5 without failure or complication.   Electronically Signed   By: Elige Ko   On: 06/11/2017 12:24   She reports that she has never smoked. She has never used smokeless tobacco. No results for input(s): HGBA1C, LABURIC in the last 8760 hours.  Objective:  VS:  HT:    WT:   BMI:     BP:(!) 195/89  HR:70bpm  TEMP: ( )  RESP:  Physical Exam Vitals and nursing note reviewed.  Constitutional:      General: She is not in acute distress.    Appearance: Normal appearance. She is  well-developed.  HENT:     Head: Normocephalic and atraumatic.     Nose: Nose normal.     Mouth/Throat:     Mouth: Mucous membranes are moist.     Pharynx: Oropharynx is clear.  Eyes:     Conjunctiva/sclera: Conjunctivae normal.     Pupils: Pupils are equal, round, and reactive to light.  Cardiovascular:     Rate and Rhythm: Regular rhythm.  Pulmonary:     Effort: Pulmonary effort is normal. No respiratory distress.  Abdominal:     General: There is no distension.     Palpations: Abdomen is soft.     Tenderness: There is no guarding.  Musculoskeletal:     Cervical back: Normal range of motion and neck supple.     Right lower leg: No edema.     Left lower leg: No edema.     Comments: Patient is somewhat slow to rise from a seated position to full extension.  She has some pain across the lower back with taut bands in the paraspinal region.  No pain over the PSIS or greater trochanter.  No pain with hip rotation good distal strength.  Skin:    General: Skin is warm and dry.     Findings: No erythema or rash.  Neurological:     General: No focal deficit present.     Mental Status: She is alert and oriented to person, place, and time.     Motor: No abnormal muscle tone.     Coordination: Coordination normal.     Gait: Gait normal.  Psychiatric:        Mood and Affect: Mood normal.        Behavior: Behavior normal.        Thought Content: Thought content normal.     Ortho Exam  Imaging: No results found.  Past Medical/Family/Surgical/Social History: Medications & Allergies reviewed per EMR, new medications updated. Patient Active Problem List   Diagnosis Date Noted  . History of lumbar spinal fusion 07/02/2017  . Protrusion of lumbar intervertebral disc 07/02/2017  . Spinal stenosis of lumbar region 08/22/2013   Past Medical History:  Diagnosis Date  . Arthritis   . GERD (gastroesophageal reflux disease)   . Hyperlipemia   . Hypertension   . PONV (postoperative  nausea and vomiting)    last time 2005   History reviewed. No pertinent family history. Past Surgical History:  Procedure Laterality Date  . BREAST SURGERY Bilateral    cyst removed  . buniectomy Left 2005  . Buninectomy Right 2013  . COLONOSCOPY     Social History   Occupational History  . Not on file  Tobacco Use  . Smoking status: Never Smoker  . Smokeless tobacco: Never Used  Substance and Sexual Activity  . Alcohol use:  No  . Drug use: No  . Sexual activity: Not on file

## 2019-08-17 IMAGING — MR MR LUMBAR SPINE WO/W CM
4 of 8 series · 17 of 48 positions shown · IV contrast (Multihance 17ml)
Comparison: None.

CLINICAL DATA: Right-sided low back pain for 3-4 weeks. No known
injury.

EXAM:
MRI LUMBAR SPINE WITHOUT AND WITH CONTRAST
TECHNIQUE: Multiplanar and multiecho pulse sequences of the lumbar spine were
obtained without and with intravenous contrast.
CONTRAST:  18mL MULTIHANCE GADOBENATE DIMEGLUMINE 529 MG/ML IV SOLN

[Series 6: T1 · sagittal · 4.0mm · 0.73mm/px · 3 of 13 slices shown (1 of 2)]
[im 1/13]
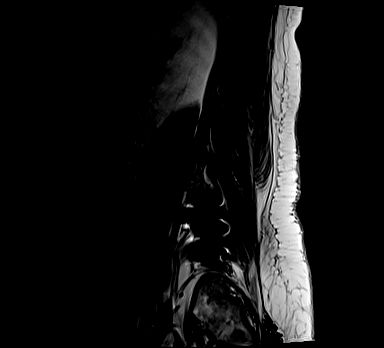
[im 7/13]
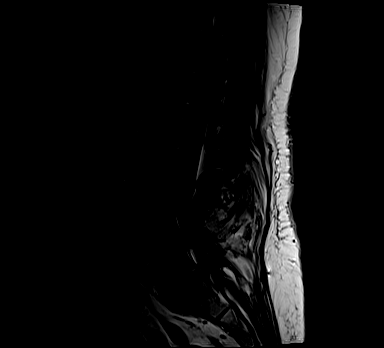
[im 13/13]
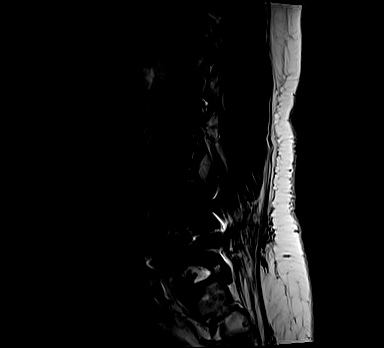

[Series 10: T1 · axial · 4.0mm · 0.28mm/px · z∈[-79,+83]mm · 3 of 35 slices shown (2 of 2)]
[im 4/35]
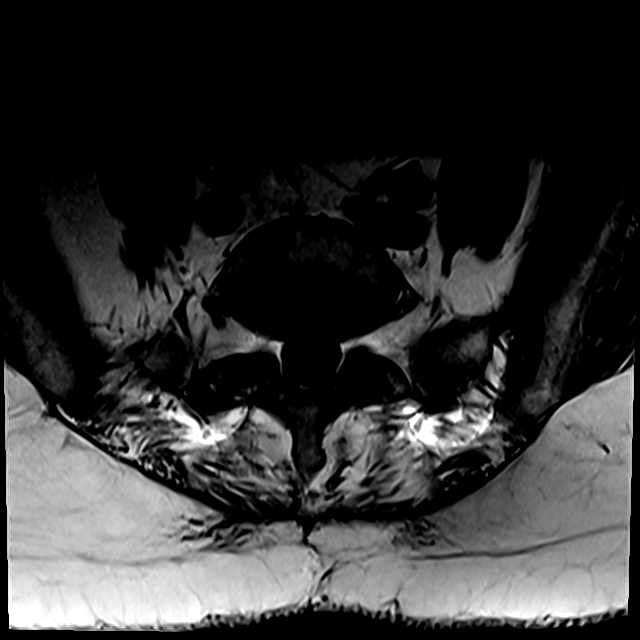
[im 19/35]
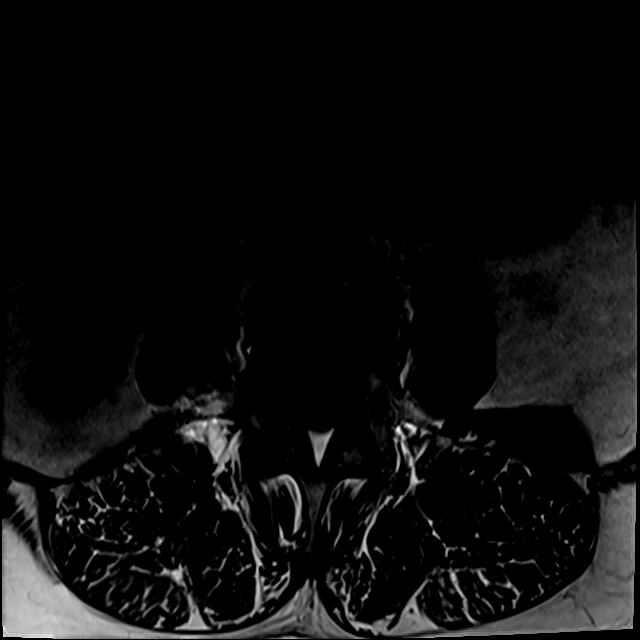
[im 31/35]
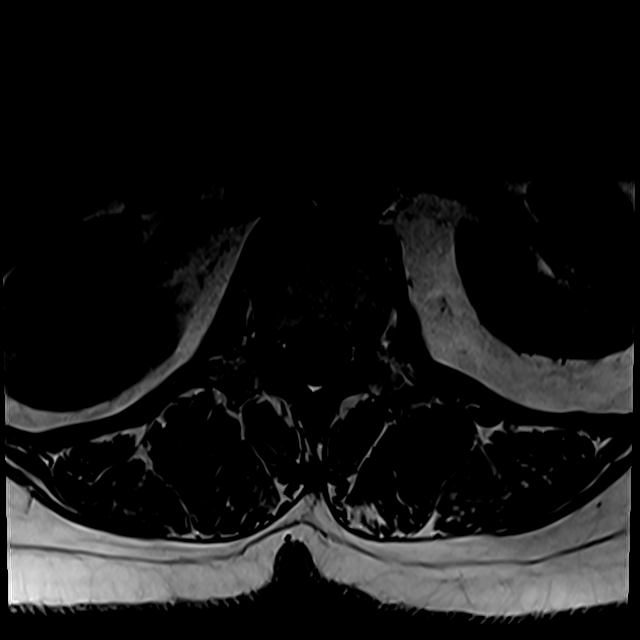

[Series 13: T2 · axial · 4.0mm · 0.28mm/px · z∈[-93,+149]mm · 8 of 35 slices shown (1 of 2)]
[im 1/35]
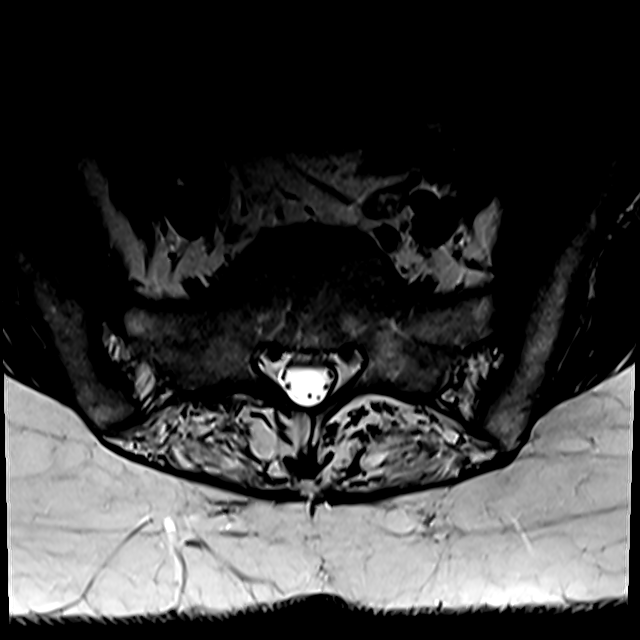
[im 4/35]
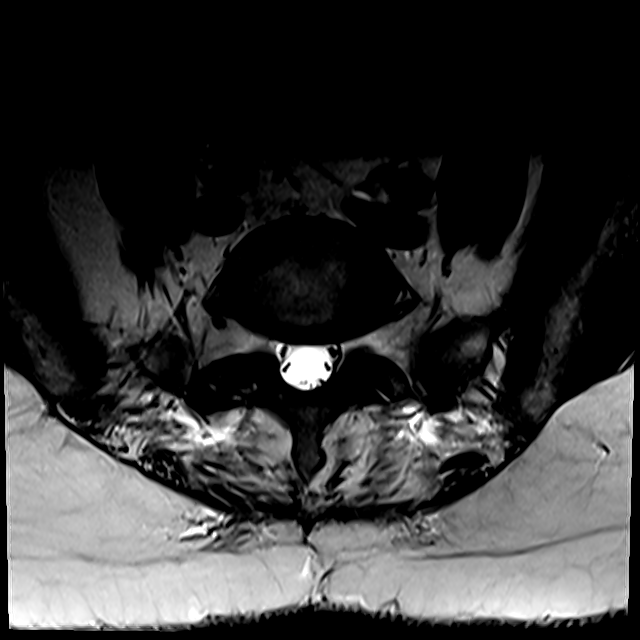
[im 12/35]
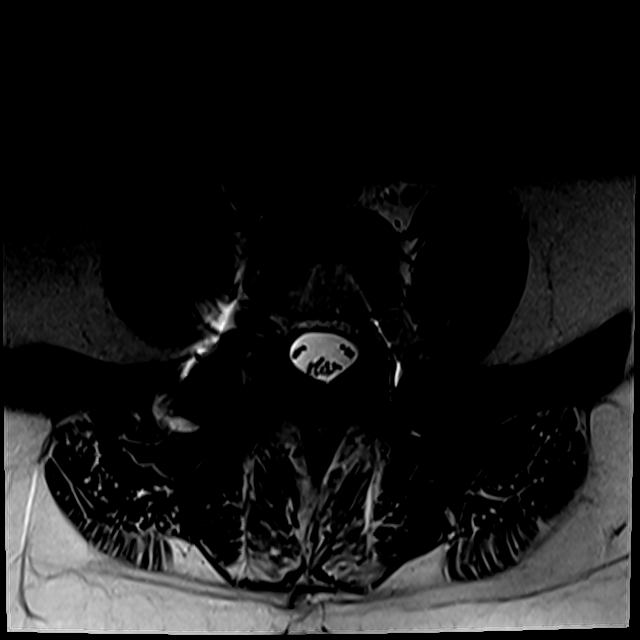
[im 16/35]
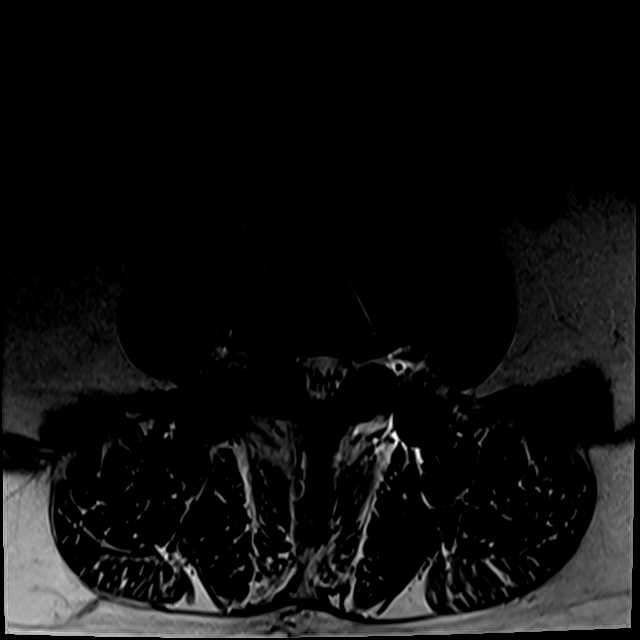
[im 19/35]
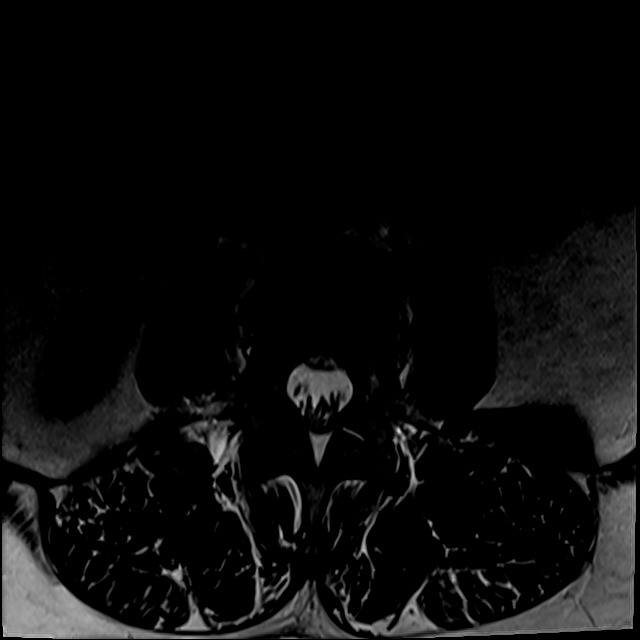
[im 23/35]
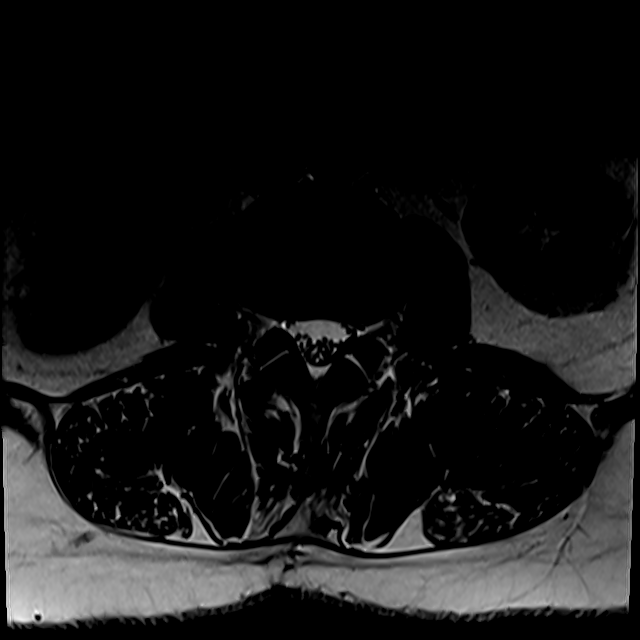
[im 31/35]
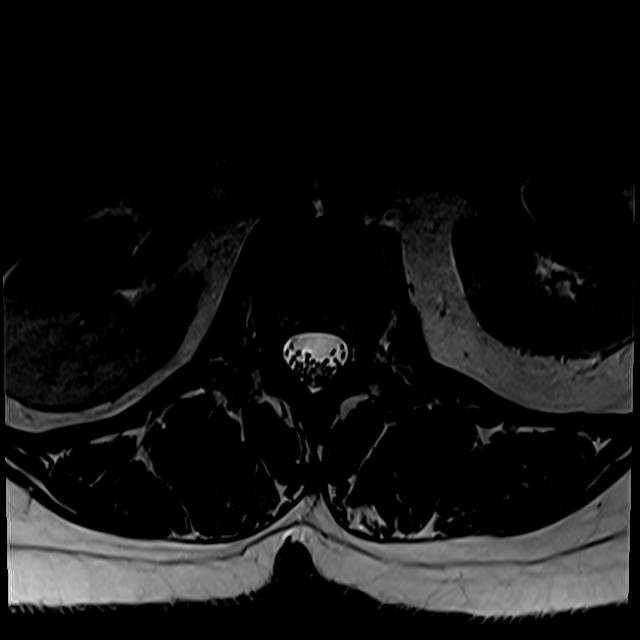
[im 35/35]
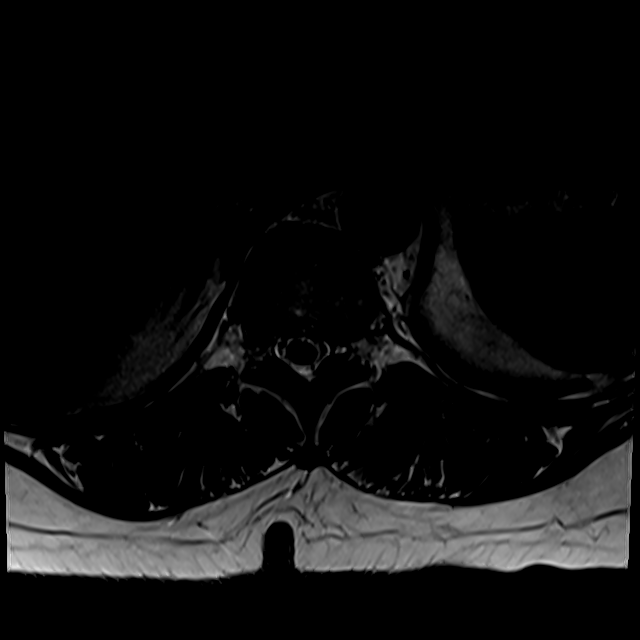

[Series 14: T2 · sagittal · 4.0mm · 0.73mm/px · 3 of 13 slices shown (2 of 2)]
[im 1/13]
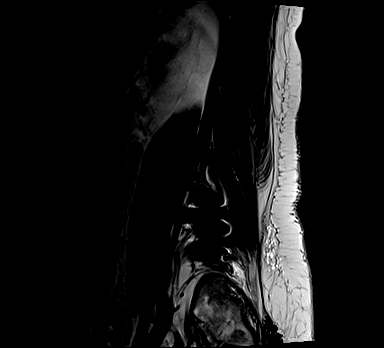
[im 9/13]
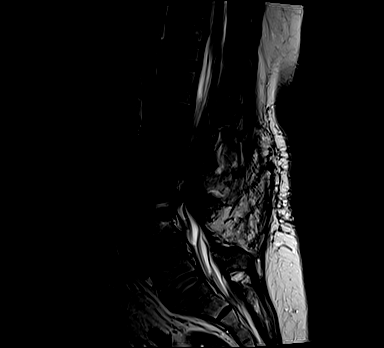
[im 13/13]
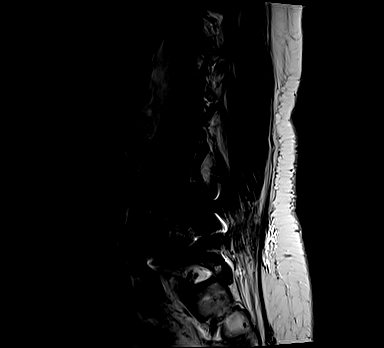

[17 of 48 positions shown; findings below may reference images not displayed]

FINDINGS: Segmentation:  Standard.

Alignment:  Physiologic.

Vertebrae:  No fracture, evidence of discitis, or bone lesion.

Conus medullaris and cauda equina: Conus extends to the T12 level.
Conus and cauda equina appear normal.

Paraspinal and other soft tissues: No acute paraspinal abnormality.

Disc levels:

Disc spaces: Degenerative disc disease with disc desiccation and
mild disc height loss at L2-3 and L3-4. Posterior lumbar interbody
fusion at L4-5.

T11-12: Small left paracentral disc protrusion.

T12-L1: No significant disc bulge. No evidence of neural foraminal
stenosis. No central canal stenosis.

L1-L2: Minimal broad-based disc bulge. No evidence of neural
foraminal stenosis. No central canal stenosis.

L2-L3: Mild broad-based disc bulge. No evidence of neural foraminal
stenosis. No central canal stenosis.

L3-L4: Mild broad-based disc bulge. Mild bilateral foraminal
stenosis. Mild spinal stenosis.

L4-L5: Interbody fusion. No evidence of neural foraminal stenosis.
No central canal stenosis.

L5-S1: No significant disc bulge. No evidence of neural foraminal
stenosis. No central canal stenosis. Moderate right facet
arthropathy.
IMPRESSION: 1. Mild lumbar spine spondylosis as described above.
2. Posterior lumbar interbody fusion at L4-5 without failure or
complication.

## 2020-09-01 ENCOUNTER — Telehealth: Payer: Self-pay | Admitting: Physical Medicine and Rehabilitation

## 2020-09-01 NOTE — Telephone Encounter (Signed)
Patient called asked if she can get an appointment set up for another injection in her leg? The number to contact patient is [phone]

## 2020-09-01 NOTE — Telephone Encounter (Signed)
Right L3 TF on 05/29/2019. Ok to repeat if helped, same problem/side, and no new injury?

## 2020-09-02 NOTE — Telephone Encounter (Signed)
Called patient to discuss. Mailbox full.

## 2020-09-02 NOTE — Telephone Encounter (Signed)
Patient called she is returning your phone call, call back:[phone]

## 2020-09-02 NOTE — Telephone Encounter (Signed)
Needs auth and scheduling for repeat right L3 TF. Patient states that she had 80-90% relief from  last injection in April of 2021.

## 2020-09-06 NOTE — Telephone Encounter (Signed)
Please call patient to schedule.

## 2020-09-15 ENCOUNTER — Encounter: Payer: Self-pay | Admitting: Physical Medicine and Rehabilitation

## 2020-09-15 ENCOUNTER — Ambulatory Visit: Payer: Medicare HMO | Admitting: Physical Medicine and Rehabilitation

## 2020-09-15 ENCOUNTER — Other Ambulatory Visit: Payer: Self-pay

## 2020-09-15 ENCOUNTER — Ambulatory Visit: Payer: Self-pay

## 2020-09-15 VITALS — BP 206/85 | HR 94

## 2020-09-15 DIAGNOSIS — M5416 Radiculopathy, lumbar region: Secondary | ICD-10-CM

## 2020-09-15 DIAGNOSIS — M961 Postlaminectomy syndrome, not elsewhere classified: Secondary | ICD-10-CM

## 2020-09-15 MED ORDER — DEXAMETHASONE SODIUM PHOSPHATE 10 MG/ML IJ SOLN
15.0000 mg | Freq: Once | INTRAMUSCULAR | Status: AC
  Administered 2020-09-15: 15 mg

## 2020-09-15 NOTE — Progress Notes (Signed)
Pt state lower back pain that travels to her right hip. Pt state laying down and sitting makes the pain worse. Pt state she takes pain meds and heating/ ice to help ease her pain.  Numeric Pain Rating Scale and Functional Assessment Average Pain 8   In the last MONTH (on 0-10 scale) has pain interfered with the following?  1. General activity like being  able to carry out your everyday physical activities such as walking, climbing stairs, carrying groceries, or moving a chair?  Rating(10)   +Driver, -BT, -Dye Allergies.

## 2020-09-15 NOTE — Patient Instructions (Signed)

## 2020-10-05 NOTE — Progress Notes (Signed)
Annette Jefferson - 70 y.o. female MRN [id number]  Date of birth: 1950/04/03  Office Visit Note: Visit Date: 09/15/2020 PCP: Clide Dales, PA Referred by: Clide Dales, *  Subjective: Chief Complaint  Patient presents with   Lower Back - Pain   Right Hip - Pain   HPI:  Annette Jefferson is a 70 y.o. female who comes in today for planned repeat Right L3-L4  Lumbar Transforaminal epidural steroid injection with fluoroscopic guidance.  The patient has failed conservative care including home exercise, medications, time and activity modification.  This injection will be diagnostic and hopefully therapeutic.  Please see requesting physician notes for further details and justification. Patient received more than 50% pain relief from prior injection. MRI reviewed with images and spine model.  MRI reviewed in the note below.    Referring: Dr. Annell Greening   ROS Otherwise per HPI.  Assessment & Plan: Visit Diagnoses:    ICD-10-CM   1. Lumbar radiculopathy  M54.16 XR C-ARM NO REPORT    Epidural Steroid injection    dexamethasone (DECADRON) injection 15 mg    2. Post laminectomy syndrome  M96.1 XR C-ARM NO REPORT    Epidural Steroid injection    dexamethasone (DECADRON) injection 15 mg      Plan: No additional findings.   Meds & Orders:  Meds ordered this encounter  Medications   dexamethasone (DECADRON) injection 15 mg    Orders Placed This Encounter  Procedures   XR C-ARM NO REPORT   Epidural Steroid injection    Follow-up: No follow-ups on file.   Procedures: No procedures performed  Lumbosacral Transforaminal Epidural Steroid Injection - Sub-Pedicular Approach with Fluoroscopic Guidance  Patient: Annette Jefferson      Date of Birth: 1950-03-30 MRN: [id number] PCP: Clide Dales, PA      Visit Date: 09/15/2020   Universal Protocol:    Date/Time: 09/15/2020  Consent Given By: the patient  Position: PRONE  Additional Comments: Vital signs were  monitored before and after the procedure. Patient was prepped and draped in the usual sterile fashion. The correct patient, procedure, and site was verified.   Injection Procedure Details:   Procedure diagnoses: Lumbar radiculopathy [M54.16]    Meds Administered:  Meds ordered this encounter  Medications   dexamethasone (DECADRON) injection 15 mg    Laterality: Right  Location/Site:  L3-L4  Needle:5.0 in., 22 ga.  Short bevel or Quincke spinal needle  Needle Placement: Transforaminal  Findings:    -Comments: Excellent flow of contrast along the nerve, nerve root and into the epidural space.  Procedure Details: After squaring off the end-plates to get a true AP view, the C-arm was positioned so that an oblique view of the foramen as noted above was visualized. The target area is just inferior to the "nose of the scotty dog" or sub pedicular. The soft tissues overlying this structure were infiltrated with 2-3 ml. of 1% Lidocaine without Epinephrine.  The spinal needle was inserted toward the target using a "trajectory" view along the fluoroscope beam.  Under AP and lateral visualization, the needle was advanced so it did not puncture dura and was located close the 6 O'Clock position of the pedical in AP tracterory. Biplanar projections were used to confirm position. Aspiration was confirmed to be negative for CSF and/or blood. A 1-2 ml. volume of Isovue-250 was injected and flow of contrast was noted at each level. Radiographs were obtained for documentation purposes.   After attaining the desired flow of  contrast documented above, a 0.5 to 1.0 ml test dose of 0.25% Marcaine was injected into each respective transforaminal space.  The patient was observed for 90 seconds post injection.  After no sensory deficits were reported, and normal lower extremity motor function was noted,   the above injectate was administered so that equal amounts of the injectate were placed at each foramen  (level) into the transforaminal epidural space.   Additional Comments:  The patient tolerated the procedure well Dressing: 2 x 2 sterile gauze and Band-Aid    Post-procedure details: Patient was observed during the procedure. Post-procedure instructions were reviewed.  Patient left the clinic in stable condition.    Clinical History: MRI LUMBAR SPINE WITHOUT AND WITH CONTRAST   TECHNIQUE: Multiplanar and multiecho pulse sequences of the lumbar spine were obtained without and with intravenous contrast.   CONTRAST:  59mL MULTIHANCE GADOBENATE DIMEGLUMINE 529 MG/ML IV SOLN   COMPARISON:  None.   FINDINGS: Segmentation:  Standard.   Alignment:  Physiologic.   Vertebrae:  No fracture, evidence of discitis, or bone lesion.   Conus medullaris and cauda equina: Conus extends to the T12 level. Conus and cauda equina appear normal.   Paraspinal and other soft tissues: No acute paraspinal abnormality.   Disc levels:   Disc spaces: Degenerative disc disease with disc desiccation and mild disc height loss at L2-3 and L3-4. Posterior lumbar interbody fusion at L4-5.   T11-12: Small left paracentral disc protrusion.   T12-L1: No significant disc bulge. No evidence of neural foraminal stenosis. No central canal stenosis.   L1-L2: Minimal broad-based disc bulge. No evidence of neural foraminal stenosis. No central canal stenosis.   L2-L3: Mild broad-based disc bulge. No evidence of neural foraminal stenosis. No central canal stenosis.   L3-L4: Mild broad-based disc bulge. Mild bilateral foraminal stenosis. Mild spinal stenosis.   L4-L5: Interbody fusion. No evidence of neural foraminal stenosis. No central canal stenosis.   L5-S1: No significant disc bulge. No evidence of neural foraminal stenosis. No central canal stenosis. Moderate right facet arthropathy.   IMPRESSION: 1. Mild lumbar spine spondylosis as described above. 2. Posterior lumbar interbody fusion at  L4-5 without failure or complication.     Electronically Signed   By: Elige Ko   On: 06/11/2017 12:24     Objective:  VS:  HT:    WT:   BMI:     BP:(!) 206/85  HR:94bpm  TEMP: ( )  RESP:  Physical Exam Vitals and nursing note reviewed.  Constitutional:      General: She is not in acute distress.    Appearance: Normal appearance. She is not ill-appearing.  HENT:     Head: Normocephalic and atraumatic.     Right Ear: External ear normal.     Left Ear: External ear normal.  Eyes:     Extraocular Movements: Extraocular movements intact.  Cardiovascular:     Rate and Rhythm: Normal rate.     Pulses: Normal pulses.  Pulmonary:     Effort: Pulmonary effort is normal. No respiratory distress.  Abdominal:     General: There is no distension.     Palpations: Abdomen is soft.  Musculoskeletal:        General: Tenderness present.     Cervical back: Neck supple.     Right lower leg: No edema.     Left lower leg: No edema.     Comments: Patient has good distal strength with no pain over the greater trochanters.  No clonus or focal weakness.  Skin:    Findings: No erythema, lesion or rash.  Neurological:     General: No focal deficit present.     Mental Status: She is alert and oriented to person, place, and time.     Sensory: No sensory deficit.     Motor: No weakness or abnormal muscle tone.     Coordination: Coordination normal.  Psychiatric:        Mood and Affect: Mood normal.        Behavior: Behavior normal.     Imaging: No results found.

## 2020-10-05 NOTE — Procedures (Signed)
Lumbosacral Transforaminal Epidural Steroid Injection - Sub-Pedicular Approach with Fluoroscopic Guidance  Patient: Annette Jefferson      Date of Birth: May 25, 1950 MRN: [id number] PCP: Clide Dales, PA      Visit Date: 09/15/2020   Universal Protocol:    Date/Time: 09/15/2020  Consent Given By: the patient  Position: PRONE  Additional Comments: Vital signs were monitored before and after the procedure. Patient was prepped and draped in the usual sterile fashion. The correct patient, procedure, and site was verified.   Injection Procedure Details:   Procedure diagnoses: Lumbar radiculopathy [M54.16]    Meds Administered:  Meds ordered this encounter  Medications   dexamethasone (DECADRON) injection 15 mg    Laterality: Right  Location/Site:  L3-L4  Needle:5.0 in., 22 ga.  Short bevel or Quincke spinal needle  Needle Placement: Transforaminal  Findings:    -Comments: Excellent flow of contrast along the nerve, nerve root and into the epidural space.  Procedure Details: After squaring off the end-plates to get a true AP view, the C-arm was positioned so that an oblique view of the foramen as noted above was visualized. The target area is just inferior to the "nose of the scotty dog" or sub pedicular. The soft tissues overlying this structure were infiltrated with 2-3 ml. of 1% Lidocaine without Epinephrine.  The spinal needle was inserted toward the target using a "trajectory" view along the fluoroscope beam.  Under AP and lateral visualization, the needle was advanced so it did not puncture dura and was located close the 6 O'Clock position of the pedical in AP tracterory. Biplanar projections were used to confirm position. Aspiration was confirmed to be negative for CSF and/or blood. A 1-2 ml. volume of Isovue-250 was injected and flow of contrast was noted at each level. Radiographs were obtained for documentation purposes.   After attaining the desired flow of  contrast documented above, a 0.5 to 1.0 ml test dose of 0.25% Marcaine was injected into each respective transforaminal space.  The patient was observed for 90 seconds post injection.  After no sensory deficits were reported, and normal lower extremity motor function was noted,   the above injectate was administered so that equal amounts of the injectate were placed at each foramen (level) into the transforaminal epidural space.   Additional Comments:  The patient tolerated the procedure well Dressing: 2 x 2 sterile gauze and Band-Aid    Post-procedure details: Patient was observed during the procedure. Post-procedure instructions were reviewed.  Patient left the clinic in stable condition.

## 2021-05-27 ENCOUNTER — Encounter: Payer: Self-pay | Admitting: Surgery

## 2021-05-27 ENCOUNTER — Ambulatory Visit: Payer: Self-pay

## 2021-05-27 ENCOUNTER — Ambulatory Visit: Payer: Medicare HMO | Admitting: Surgery

## 2021-05-27 VITALS — BP 217/97 | HR 97 | Ht 65.0 in | Wt 180.0 lb

## 2021-05-27 DIAGNOSIS — M25532 Pain in left wrist: Secondary | ICD-10-CM

## 2021-05-27 DIAGNOSIS — M67439 Ganglion, unspecified wrist: Secondary | ICD-10-CM

## 2021-05-27 DIAGNOSIS — M19032 Primary osteoarthritis, left wrist: Secondary | ICD-10-CM

## 2021-05-27 NOTE — Progress Notes (Signed)
Office Visit Note   Patient: Annette Jefferson           Date of Birth: [birth year]           MRN: [id number] Visit Date: 05/27/2021              Requested by: Clide Dales, PA No address on file PCP: Clide Dales, PA   Assessment & Plan: Visit Diagnoses:  1. Pain in left wrist   2. Dorsal wrist ganglion left   3. Localized primary osteoarthritis of carpometacarpal (CMC) joint of left wrist     Plan: Advised patient that treatments for the problems that she has would be ganglion cyst excision which I could refer her to Dr. Frazier Butt for this.  In regards to the [facility] DJD with subluxation at that joint I would send her to Dr. Amanda Pea and she would like to have something done there.  Patient was given a neoprene CMC thumb abduction splint.  Follow-up as needed and again will let me know if she would like to see either one of the hand surgeons.  Follow-Up Instructions: Return in about 1 week (around 06/03/2021), or if symptoms worsen or fail to improve.   Orders:  Orders Placed This Encounter  Procedures   XR Wrist 2 Views Left   No orders of the defined types were placed in this encounter.     Procedures: No procedures performed   Clinical Data: No additional findings.   Subjective: Chief Complaint  Patient presents with   Left Wrist - Pain    HPI 71 year old female comes into the office today with complaints of possible cyst left hand.  Cyst for about 5 months.  Hurts when she moves her hand.  Has taken Mobic without any improvement.  Patient has a history of first CMC DJD.  Pain at the joint when gripping.. Review of Systems No current complaints of cardiopulmonary GI/GU issues  Objective: Vital Signs: BP (!) 217/97   Pulse 97   Ht 5\' 5"  (1.651 m)   Wt 180 lb (81.6 kg)   BMI 29.95 kg/m   Physical Exam Constitutional:      Appearance: Normal appearance.  HENT:     Head: Normocephalic.     Nose: Nose normal.  Pulmonary:     Effort: Pulmonary  effort is normal.  Musculoskeletal:     Comments: Left wrist patient has tenderness over the first [facility] joint.  Positive CMC grind.  There is a small 2 to 3 mm dorsal radial ganglion cyst.  Neurological:     Mental Status: She is alert and oriented to person, place, and time.     Ortho Exam  Specialty Comments:  No specialty comments available.  Imaging: No results found.   PMFS History: Patient Active Problem List   Diagnosis Date Noted   Arthritis of carpometacarpal [facility]) joint of left thumb 06/02/2021   History of lumbar spinal fusion 07/02/2017   Protrusion of lumbar intervertebral disc 07/02/2017   Spinal stenosis of lumbar region 08/22/2013   Past Medical History:  Diagnosis Date   Arthritis    GERD (gastroesophageal reflux disease)    Hyperlipemia    Hypertension    PONV (postoperative nausea and vomiting)    last time 2005    History reviewed. No pertinent family history.  Past Surgical History:  Procedure Laterality Date   BREAST SURGERY Bilateral    cyst removed   buniectomy Left 2005   Buninectomy Right 2013   COLONOSCOPY  Social History   Occupational History   Not on file  Tobacco Use   Smoking status: Never   Smokeless tobacco: Never  Substance and Sexual Activity   Alcohol use: No   Drug use: No   Sexual activity: Not on file

## 2021-06-02 ENCOUNTER — Ambulatory Visit: Payer: Medicare HMO | Admitting: Orthopedic Surgery

## 2021-06-02 DIAGNOSIS — M1812 Unilateral primary osteoarthritis of first carpometacarpal joint, left hand: Secondary | ICD-10-CM | POA: Diagnosis not present

## 2021-06-02 MED ORDER — BETAMETHASONE SOD PHOS & ACET 6 (3-3) MG/ML IJ SUSP
6.0000 mg | INTRAMUSCULAR | Status: AC | PRN
  Administered 2021-06-02: 6 mg via INTRA_ARTICULAR

## 2021-06-02 MED ORDER — LIDOCAINE HCL 1 % IJ SOLN
1.0000 mL | INTRAMUSCULAR | Status: AC | PRN
  Administered 2021-06-02: 1 mL

## 2021-06-02 NOTE — Progress Notes (Signed)
? ?Office Visit Note ?  ?Patient: Annette Jefferson           ?Date of Birth: 1950/07/18           ?MRN: [id number] ?Visit Date: 06/02/2021 ?             ?Requested by: Clide Dales, PA ?No address on file ?PCP: Clide Dales, PA ? ? ?Assessment & Plan: ?Visit Diagnoses:  ?1. Arthritis of carpometacarpal (CMC) joint of left thumb   ? ? ?Plan: We discussed the nature of thumb CMC osteoarthritis as well as its diagnosis, prognosis, and both conservative and surgical treatment options.  She has not tried any form of immobilization or bracing and is unable to take oral NSAIDs.  She would like to proceed with a corticosteroid injection today.  I will also refer her to hand therapy to have a Thermoplast brace made.  I can see her back in the office when and if her symptoms return. ? ?Follow-Up Instructions: No follow-ups on file.  ? ?Orders:  ?No orders of the defined types were placed in this encounter. ? ?No orders of the defined types were placed in this encounter. ? ? ? ? Procedures: ?Hand/UE Inj: L thumb CMC for osteoarthritis on 06/02/2021 1:36 PM ?Indications: therapeutic ?Details: 25 G needle, fluoroscopy-guided radial approach ?Medications: 1 mL lidocaine 1 %; 6 mg betamethasone acetate-betamethasone sodium phosphate 6 (3-3) MG/ML ?Procedure, treatment alternatives, risks and benefits explained, specific risks discussed. Consent was given by the patient. Immediately prior to procedure a time out was called to verify the correct patient, procedure, equipment, support staff and site/side marked as required. Patient was prepped and draped in the usual sterile fashion.  ? ? ? ? ?Clinical Data: ?No additional findings. ? ? ?Subjective: ?Chief Complaint  ?Patient presents with  ? Left Wrist - Pain  ?  Xray with Zonia Kief, PA 05/27/21  ? ? 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  This has been going on for years now.   She localizes the pain to the base of the thumb.  Pain is worse with certain activities such as sewing.  She has not had any treatment so far.  She has no pain at the MP joint.  She has no pain over the radial styloid.  She also has a very small dorsal ganglion cyst but this is not symptomatic for her. ? ? ?Review of Systems ? ? ?Objective: ?Vital Signs: There were no vitals taken for this visit. ? ?Physical Exam ?Constitutional:   ?   Appearance: Normal appearance.  ?Cardiovascular:  ?   Rate and Rhythm: Normal rate.  ?   Pulses: Normal pulses.  ?Pulmonary:  ?   Effort: Pulmonary effort is normal.  ?Skin: ?   General: Skin is warm and dry.  ?   Capillary Refill: Capillary refill takes less than 2 seconds.  ?Neurological:  ?   Mental Status: She is alert.  ? ? ?Left Hand Exam  ? ?Tenderness  ?Left hand tenderness location: TTP at base of thumb at [facility] joint.  ? ?Other  ?Erythema: absent ?Sensation: normal ?Pulse: present ? ?Comments:  Pain w/ CMC grind test.  Static MP hyper-extension of ~45 degrees.  No dynamic hyper-extension with pinch.  Mild to moderate palmar abduction contracture of thumb.  ? ? ? ? ?Specialty Comments:  ?No specialty comments available. ? ?Imaging: ?No results found. ? ? ?PMFS History: ?Patient  Active Problem List  ? Diagnosis Date Noted  ? Arthritis of carpometacarpal [facility]) joint of left thumb 06/02/2021  ? History of lumbar spinal fusion 07/02/2017  ? Protrusion of lumbar intervertebral disc 07/02/2017  ? Spinal stenosis of lumbar region 08/22/2013  ? ?Past Medical History:  ?Diagnosis Date  ? Arthritis   ? GERD (gastroesophageal reflux disease)   ? Hyperlipemia   ? Hypertension   ? PONV (postoperative nausea and vomiting)   ? last time 2005  ?  ?No family history on file.  ?Past Surgical History:  ?Procedure Laterality Date  ? BREAST SURGERY Bilateral   ? cyst removed  ? buniectomy Left 2005  ? Buninectomy Right 2013  ? COLONOSCOPY    ? ?Social History  ? ?Occupational History  ? Not on file   ?Tobacco Use  ? Smoking status: Never  ? Smokeless tobacco: Never  ?Substance and Sexual Activity  ? Alcohol use: No  ? Drug use: No  ? Sexual activity: Not on file  ? ? ? ? ? ? ?

## 2021-06-07 ENCOUNTER — Encounter: Payer: Medicare HMO | Admitting: Rehabilitative and Restorative Service Providers"

## 2021-06-07 ENCOUNTER — Telehealth: Payer: Self-pay | Admitting: Rehabilitative and Restorative Service Providers"

## 2021-06-07 NOTE — Therapy (Incomplete)
?OUTPATIENT OCCUPATIONAL THERAPY ORTHO EVALUATION ? ?Patient Name: Annette Jefferson ?MRN: [id number] ?DOB:01-28-1951, 71 y.o., female ?Today's Date: 06/07/2021 ? ?PCP: *** ?REFERRING PROVIDER: *** ? ? ? ?Past Medical History:  ?Diagnosis Date  ? Arthritis   ? GERD (gastroesophageal reflux disease)   ? Hyperlipemia   ? Hypertension   ? PONV (postoperative nausea and vomiting)   ? last time 2005  ? ?Past Surgical History:  ?Procedure Laterality Date  ? BREAST SURGERY Bilateral   ? cyst removed  ? buniectomy Left 2005  ? Buninectomy Right 2013  ? COLONOSCOPY    ? ?Patient Active Problem List  ? Diagnosis Date Noted  ? Arthritis of carpometacarpal [facility]) joint of left thumb 06/02/2021  ? History of lumbar spinal fusion 07/02/2017  ? Protrusion of lumbar intervertebral disc 07/02/2017  ? Spinal stenosis of lumbar region 08/22/2013  ? ? ?ONSET DATE: Chronic (years)  ? ?REFERRING DIAG: *** ? ?THERAPY DIAG:  ?No diagnosis found. ? ?SUBJECTIVE:  ? ?SUBJECTIVE STATEMENT: ?*** ? ? 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  This has been going on for years now.  She localizes the pain to the base of the thumb.  Pain is worse with certain activities such as sewing.  She has not had any treatment so far." ? ?PRECAUTIONS: {Therapy precautions:24002} ? ?WEIGHT BEARING RESTRICTIONS {Yes ***/No:24003} ? ?PAIN:  ?Are you having pain? {OPRCPAIN:27236} ? ?FALLS: Has patient fallen in last 6 months? {fallsyesno:27318} ? ?LIVING ENVIRONMENT: ?Lives with: {OPRC lives with:25569::"lives with their family"} ?Lives in: {Lives in:25570} ?Stairs: {opstairs:27293} ?Has following equipment at home: {Assistive devices:23999} ? ?PLOF: {PLOF:24004} ? ?PATIENT GOALS *** ? ?OBJECTIVE:  ? ?HAND DOMINANCE: {MISC; OT HAND DOMINANCE:743 180 1327} ? ?ADLs: ?Overall ADLs: *** ?Transfers/ambulation related to ADLs: ?Eating: *** ?Grooming: *** ?UB  Dressing: *** ?LB Dressing: *** ?Toileting: *** ?Bathing: *** ?Tub Shower transfers: *** ?Equipment: {equipment:25573} ? ?FUNCTIONAL OUTCOME MEASURES: ?Quick Dash: *** ? ?UE ROM    ? ?{AROM/PROM:27142} ROM Right ?06/07/2021 Left ?06/07/2021  ?Shoulder flexion    ?Shoulder abduction    ?Shoulder adduction    ?Shoulder extension    ?Shoulder internal rotation    ?Shoulder external rotation    ?Elbow flexion    ?Elbow extension    ?Wrist flexion    ?Wrist extension    ?Wrist ulnar deviation    ?Wrist radial deviation    ?Wrist pronation    ?Wrist supination    ?(Blank rows = not tested) ? ?{AROM/PROM:27142} ROM Right ?06/07/2021 Left ?06/07/2021  ?Thumb MCP (0-60)    ?Thumb IP (0-80)    ?Thumb Radial abd/add (0-55)     ?Thumb Palmar abd/add (0-45)     ?Thumb Opposition to Small Finger     ?Index MCP (0-90)     ?Index PIP (0-100)     ?Index DIP (0-70)      ?Long MCP (0-90)      ?Long PIP (0-100)      ?Long DIP (0-70)      ?Ring MCP (0-90)      ?Ring PIP (0-100)      ?Ring DIP (0-70)      ?Little MCP (0-90)      ?Little PIP (0-100)      ?Little DIP (0-70)      ?(Blank rows = not tested) ? ? ?UE MMT:    ? ?MMT Right ?06/07/2021 Left ?06/07/2021  ?Shoulder flexion    ?Shoulder abduction    ?  Shoulder adduction    ?Shoulder extension    ?Shoulder internal rotation    ?Shoulder external rotation    ?Middle trapezius    ?Lower trapezius    ?Elbow flexion    ?Elbow extension    ?Wrist flexion    ?Wrist extension    ?Wrist ulnar deviation    ?Wrist radial deviation    ?Wrist pronation    ?Wrist supination    ?(Blank rows = not tested) ? ?HAND FUNCTION: ?{handfunction:27230} ? ?COORDINATION: ?{otcoordination:27237} ? ?SENSATION: ?{sensation:27233} ? ?EDEMA: *** ? ?COGNITION: ?Overall cognitive status: {cognition:24006} ?Areas of impairment: {impairedcognition:27234} ? ?OBSERVATIONS: *** ? ? ?TODAY'S TREATMENT:  ?06/07/21 Eval: Custom orthotic fabrication was indicated due to pt's *** and need for safe, functional positioning. OT fabricated  custom *** orthotic for pt today to ***. It fit well with no areas of pressure, pt states a comfortable fit. Pt was educated on the wearing schedule, to call or come in ASAP if it is causing any irritation or is not achieving desired function. It will be checked/adjusted in upcoming sessions, as needed. Pt states understanding.  ?*** ? ? ?PATIENT EDUCATION: ?Education details: see tx section above for details  ?Person educated: Patient ?Education method: Explanation, Demonstration, Verbal cues, and Handouts ?Education comprehension: verbalized understanding, returned demonstration, verbal cues required, and needs further education ? ? ?HOME EXERCISE PROGRAM: ?*** ? ?GOALS: ?Goals reviewed with patient? {yes/no:20286} ? ?SHORT TERM GOALS: (STG required if POC>30 days) ? ?Pt will obtain protective, custom orthotic. ?Target date: *** ?Goal status: MET ? ?2.  Pt will demo/state understanding of initial HEP to improve pain levels and prerequisite motion. ?Target date: *** ?Goal status: INITIAL ? ? ?LONG TERM GOALS: ? ?Pt will improve functional ability by decreased impairment per Quick DASH assessment from ***% to ***% or better, for better quality of life. ?Target date: *** ?Goal status: INITIAL ? ?2.  Pt will improve grip strength in *** hand from ***lbs to at least ***lbs for functional use at home and in IADLs. ?Target date: *** ?Goal status: INITIAL ? ?3.  Pt will improve A/ROM in *** from *** to at least ***, to have functional motion for tasks like reach and grasp.  ? ?Target date: *** ?Goal status: INITIAL ? ?4.  Pt will improve strength in *** from *** MMT to at least *** MMT to have increased functional ability to carry out selfcare and higher-level homecare tasks with no difficulty. ?Target date: *** ?Goal status: INITIAL ? ?5.  Pt will decrease pain at worst from ***/10 to ***/10 or better to have better sleep and occupational participation in daily roles. ?Target date: *** ?Goal status:  INITIAL ? ? ?ASSESSMENT: ? ?CLINICAL IMPRESSION: ?Patient is a 71 y.o. female who was seen today for occupational therapy evaluation for ***.  ? ?PERFORMANCE DEFICITS in functional skills including {OT physical skills:25468}, cognitive skills including {OT cognitive skills:25469}, and psychosocial skills including {OT psychosocial skills:25470}.  ? ?IMPAIRMENTS are limiting patient from {OT performance deficits:25471}.  ? ?COMORBIDITIES {Comorbidities:25485} that affects occupational performance. Patient will benefit from skilled OT to address above impairments and improve overall function. ? ?MODIFICATION OR ASSISTANCE TO COMPLETE EVALUATION: {OT modification:25474} ? ?OT OCCUPATIONAL PROFILE AND HISTORY: {OT PROFILE AND HISTORY:25484} ? ?CLINICAL DECISION MAKING: {OT CDM:25475} ? ?REHAB POTENTIAL: {rehabpotential:25112} ? ?EVALUATION COMPLEXITY: {Evaluation complexity:25115} ? ? ? ? ? ?PLAN: ?OT FREQUENCY: {rehab frequency:25116} ? ?OT DURATION: {rehab duration:25117} ? ?PLANNED INTERVENTIONS: {OT Interventions:25467} ? ?RECOMMENDED OTHER SERVICES: *** ? ?CONSULTED AND AGREED WITH PLAN OF CARE: {  TNB:39672} ? ?PLAN FOR NEXT SESSION: *** ? ? ?Benito Mccreedy, OTR/L, CHT ?06/07/2021, 7:58 AM ?  ?

## 2021-06-07 NOTE — Telephone Encounter (Signed)
Called her about not showing up to OT eval, she did not answer and her VM box was full. She can reschedule if she needs therapy.  ?

## 2022-01-12 ENCOUNTER — Telehealth: Payer: Self-pay

## 2022-01-12 NOTE — Telephone Encounter (Signed)
Called pt to find out if she is still a pt at [facility]. No answer lvm.

## 2022-01-20 ENCOUNTER — Other Ambulatory Visit: Payer: Self-pay

## 2022-05-16 ENCOUNTER — Telehealth: Payer: Self-pay | Admitting: Physical Medicine and Rehabilitation

## 2022-05-16 NOTE — Telephone Encounter (Signed)
Patient asking to get an appointment to receive another shot. Please advise

## 2022-05-17 ENCOUNTER — Telehealth: Payer: Self-pay | Admitting: Physical Medicine and Rehabilitation

## 2022-05-17 NOTE — Telephone Encounter (Signed)
Spoke with patient and scheduled OV for 05/19/22.

## 2022-05-17 NOTE — Telephone Encounter (Signed)
LVM to return call to schedule OV. Patient last seen 09/2020

## 2022-05-17 NOTE — Telephone Encounter (Signed)
Patient called needing to schedule an appointment with Dr. Alvester Morin for her back. The number to contact patient is [phone]

## 2022-05-19 ENCOUNTER — Ambulatory Visit: Payer: Medicare HMO | Admitting: Physical Medicine and Rehabilitation

## 2022-05-19 ENCOUNTER — Encounter: Payer: Self-pay | Admitting: Physical Medicine and Rehabilitation

## 2022-05-19 DIAGNOSIS — M5416 Radiculopathy, lumbar region: Secondary | ICD-10-CM | POA: Diagnosis not present

## 2022-05-19 DIAGNOSIS — M7918 Myalgia, other site: Secondary | ICD-10-CM

## 2022-05-19 DIAGNOSIS — M961 Postlaminectomy syndrome, not elsewhere classified: Secondary | ICD-10-CM | POA: Diagnosis not present

## 2022-05-19 MED ORDER — MELOXICAM 15 MG PO TABS: 15.0000 mg | ORAL_TABLET | Freq: Every day | ORAL | 0 refills | Status: DC

## 2022-05-19 MED ORDER — PREDNISONE 50 MG PO TABS
50.0000 mg | ORAL_TABLET | Freq: Every day | ORAL | 0 refills | Status: DC
Start: 2022-05-19 — End: 2022-09-19

## 2022-05-19 NOTE — Progress Notes (Signed)
Annette Jefferson - 72 y.o. female MRN [id number]  Date of birth: 07/10/1950  Office Visit Note: Visit Date: 05/19/2022 PCP: Clide Dales, PA Referred by: Clide Dales, *  Subjective: Chief Complaint  Patient presents with   Lower Back - Pain   HPI: Annette Jefferson is a 72 y.o. female who comes in today for evaluation of acute on chronic right lower back pain radiating to buttock, hip and right posterolateral leg to knee. [place] 05/15/2022 when pain started. Pain worsens with movement, activity and prolonged sitting. She describes pain as sore and aching sensation, currently rates as 8 out of 10. Some relief of pain with home exercise regimen, rest and use of medications. Some relief with Voltaren gel and Meloxicam. Lumbar MRI imaging from 2019 exhibits posterior lumbar interbody fusion at L4-L5 without failure or Complication, mild lumbar spondylosis. History of L4-L5 PLIF in 2015 by Dr. Annell Greening. Patient underwent multiple lumbar epidural steroid injections in our office over the years, most recent was right L3 transforaminal epidural steroid on 09/15/2020. She reports significant relief of pain with these injections. Patient retired in 2023, she does well managing chronic back issues at home with regular physical activity and medications as needed. She reports intolerance to opioid medications due to dizziness/drowsiness. Patient denies focal weakness, numbness and tingling. No recent trauma or falls.     Review of Systems  Musculoskeletal:  Positive for back pain and myalgias.  Neurological:  Negative for tingling, sensory change, focal weakness and weakness.  All other systems reviewed and are negative.  Otherwise per HPI.  Assessment & Plan: Visit Diagnoses:    ICD-10-CM   1. Lumbar radiculopathy  M54.16 Ambulatory referral to Physical Therapy    2. Myofascial pain syndrome  M79.18 Ambulatory referral to Physical Therapy     3. Post laminectomy syndrome  M96.1 Ambulatory referral to Physical Therapy       Plan: Findings:  Acute on chronic right lower back pain radiating to buttock, hip and right posterolateral leg to knee. Patient continues to have severe pain despite good conservative therapies such as home exercise regimen, rest and use of medications. Patients clinical presentation and exam are consistent with lumbar strain. I feel this is more of an acute exacerbation of pain. Her pain distribution is more of L5 nerve pattern. She does have tender localized region of pain to right buttock that I feel could be more myofascial related. I discussed medication management with her today and prescribed both Meloxicam and short course of oral Prednisone. I also placed order for patient to re-group with physical therapy. I do feel she would benefit from manual treatments and core strengthening. I would like patient to follow up in approximately 6 weeks for re-evaluation. If her pain persists we would be quick to order new lumbar MRI imaging, would also consider performing lumbar epidural steroid injection. I instructed patient to call if her pain worsens or changes. No red flag symptoms noted upon exam today.     Meds & Orders:  Meds ordered this encounter  Medications   meloxicam (MOBIC) 15 MG tablet    Sig: Take 1 tablet (15 mg total) by mouth daily.    Dispense:  30 tablet    Refill:  0   predniSONE (DELTASONE) 50 MG tablet    Sig: Take 1 tablet (50 mg total) by mouth daily with breakfast. Take until completed.    Dispense:  5 tablet  Refill:  0    Orders Placed This Encounter  Procedures   Ambulatory referral to Physical Therapy    Follow-up: Return for 6 week follow up for re-evaluation.   Procedures: No procedures performed      Clinical History: Narrative & Impression CLINICAL DATA:  Right-sided low back pain for 3-4 weeks. No known injury.   EXAM: MRI LUMBAR SPINE WITHOUT AND WITH CONTRAST    TECHNIQUE: Multiplanar and multiecho pulse sequences of the lumbar spine were obtained without and with intravenous contrast.   CONTRAST:  75mL MULTIHANCE GADOBENATE DIMEGLUMINE 529 MG/ML IV SOLN   COMPARISON:  None.   FINDINGS: Segmentation:  Standard.   Alignment:  Physiologic.   Vertebrae:  No fracture, evidence of discitis, or bone lesion.   Conus medullaris and cauda equina: Conus extends to the T12 level. Conus and cauda equina appear normal.   Paraspinal and other soft tissues: No acute paraspinal abnormality.   Disc levels:   Disc spaces: Degenerative disc disease with disc desiccation and mild disc height loss at L2-3 and L3-4. Posterior lumbar interbody fusion at L4-5.   T11-12: Small left paracentral disc protrusion.   T12-L1: No significant disc bulge. No evidence of neural foraminal stenosis. No central canal stenosis.   L1-L2: Minimal broad-based disc bulge. No evidence of neural foraminal stenosis. No central canal stenosis.   L2-L3: Mild broad-based disc bulge. No evidence of neural foraminal stenosis. No central canal stenosis.   L3-L4: Mild broad-based disc bulge. Mild bilateral foraminal stenosis. Mild spinal stenosis.   L4-L5: Interbody fusion. No evidence of neural foraminal stenosis. No central canal stenosis.   L5-S1: No significant disc bulge. No evidence of neural foraminal stenosis. No central canal stenosis. Moderate right facet arthropathy.   IMPRESSION: 1. Mild lumbar spine spondylosis as described above. 2. Posterior lumbar interbody fusion at L4-5 without failure or complication.     Electronically Signed   By: Elige Ko   On: 06/11/2017 12:24   She reports that she has never smoked. She has never used smokeless tobacco. No results for input(s): "HGBA1C", "LABURIC" in the last 8760 hours.  Objective:  VS:  HT:    WT:   BMI:     BP:   HR: bpm  TEMP: ( )  RESP:  Physical Exam Vitals and nursing note reviewed.  HENT:      Head: Normocephalic and atraumatic.     Right Ear: External ear normal.     Left Ear: External ear normal.     Nose: Nose normal.     Mouth/Throat:     Mouth: Mucous membranes are moist.  Eyes:     Pupils: Pupils are equal, round, and reactive to light.  Cardiovascular:     Rate and Rhythm: Normal rate.     Pulses: Normal pulses.  Pulmonary:     Effort: Pulmonary effort is normal.  Abdominal:     General: Abdomen is flat. There is no distension.  Musculoskeletal:        General: Tenderness present.     Cervical back: Normal range of motion.     Comments: Patient rises from seated position to standing without difficulty. Good lumbar range of motion. No pain noted with facet loading. 5/5 strength noted with bilateral hip flexion, knee flexion/extension, ankle dorsiflexion/plantarflexion and EHL. No clonus noted bilaterally. No pain upon palpation of greater trochanters. No pain with internal/external rotation of bilateral hips. Sensation intact bilaterally. Tenderness noted to right buttock region upon palpation. Negative slump test  bilaterally. Ambulates without aid, gait steady.     Skin:    General: Skin is warm and dry.     Capillary Refill: Capillary refill takes less than 2 seconds.  Neurological:     General: No focal deficit present.     Mental Status: She is alert and oriented to person, place, and time.  Psychiatric:        Mood and Affect: Mood normal.        Behavior: Behavior normal.     Ortho Exam  Imaging: No results found.  Past Medical/Family/Surgical/Social History: Medications & Allergies reviewed per EMR, new medications updated. Patient Active Problem List   Diagnosis Date Noted   Arthritis of carpometacarpal [facility]) joint of left thumb 06/02/2021   History of lumbar spinal fusion 07/02/2017   Protrusion of lumbar intervertebral disc 07/02/2017   Spinal stenosis of lumbar region 08/22/2013   Past Medical History:  Diagnosis Date   Arthritis     GERD (gastroesophageal reflux disease)    Hyperlipemia    Hypertension    PONV (postoperative nausea and vomiting)    last time 2005   History reviewed. No pertinent family history. Past Surgical History:  Procedure Laterality Date   BREAST SURGERY Bilateral    cyst removed   buniectomy Left 2005   Buninectomy Right 2013   COLONOSCOPY     Social History   Occupational History   Not on file  Tobacco Use   Smoking status: Never   Smokeless tobacco: Never  Substance and Sexual Activity   Alcohol use: No   Drug use: No   Sexual activity: Not on file

## 2022-05-19 NOTE — Progress Notes (Unsigned)
Functional Pain Scale - descriptive words and definitions  Distressing (6)    Pain is present/unable to complete most ADLs limited by pain/sleep is difficult and active distraction is only marginal. Moderate range order  Average Pain 9  Lower back pain on right side that radiates down the right leg to the knee

## 2022-06-12 ENCOUNTER — Telehealth: Payer: Self-pay | Admitting: Physical Medicine and Rehabilitation

## 2022-06-12 NOTE — Telephone Encounter (Signed)
Patient returned call asked for a call back to R/S appointment and to discuss (PT). Patient said she need the number to (PT) off of 68.  Patient said she is still in a great deal of pain.    The number to contact patient is [phone]

## 2022-06-12 NOTE — Telephone Encounter (Signed)
Spoke with patient and rescheduled OV for 07/18/22. Message sent to PT at [facility] on 68

## 2022-06-30 ENCOUNTER — Ambulatory Visit: Payer: Medicare HMO | Admitting: Physical Medicine and Rehabilitation

## 2022-07-05 NOTE — Therapy (Signed)
OUTPATIENT PHYSICAL THERAPY THORACOLUMBAR EVALUATION   Patient Name: Annette Jefferson MRN: [id number] DOB:March 06, 1950, 72 y.o., female Today's Date: 07/06/2022  END OF SESSION:  PT End of Session - 07/06/22 1002     Visit Number 1    Number of Visits 12    Date for PT Re-Evaluation 08/17/22    Authorization Type [employer] MCR    Authorization Time Period pending authorization    Progress Note Due on Visit 10    PT Start Time 1005    PT Stop Time 1048    PT Time Calculation (min) 43 min    Activity Tolerance Patient tolerated treatment well    Behavior During Therapy WFL for tasks assessed/performed             Past Medical History:  Diagnosis Date   Arthritis    GERD (gastroesophageal reflux disease)    Hyperlipemia    Hypertension    PONV (postoperative nausea and vomiting)    last time 2005   Past Surgical History:  Procedure Laterality Date   BREAST SURGERY Bilateral    cyst removed   buniectomy Left 2005   Buninectomy Right 2013   COLONOSCOPY     Patient Active Problem List   Diagnosis Date Noted   Arthritis of carpometacarpal [facility]) joint of left thumb 06/02/2021   History of lumbar spinal fusion 07/02/2017   Protrusion of lumbar intervertebral disc 07/02/2017   Spinal stenosis of lumbar region 08/22/2013    PCP: Kallie Locks, FNP   REFERRING PROVIDER: Juanda Chance, NP   REFERRING DIAG: M54.16 (ICD-10-CM) - Lumbar radiculopathy M79.18 (ICD-10-CM) - Myofascial pain syndrome M96.1 (ICD-10-CM) - Post laminectomy syndrome  Rationale for Evaluation and Treatment: Rehabilitation  THERAPY DIAG:  Other low back pain  Cramp and spasm  ONSET DATE: 05/15/2022  SUBJECTIVE:                                                                                                                                                                                           SUBJECTIVE STATEMENT: Patient reported she stepped wrong, hurting her R hip more than her  back, her suitcase caught the gangway and stumbled, stretching the muscle, pain all the way home.  First thought it was my disc, had been doing good until that day.  Now pain in upper R buttock, sometimes goes down to my knee.  Occasionally pain goes into my groin too, went to my gyno and got that checked out, he thought it was from the muscle spasm in my back too, that has gone away.  Pain is worst in the morning.    PERTINENT  HISTORY:  From MD note "HPI: Annette Jefferson is a 72 y.o. female who comes in today for evaluation of acute on chronic right lower back pain radiating to buttock, hip and right posterolateral leg to knee. [place] 05/15/2022 when pain started. Pain worsens with movement, activity and prolonged sitting. She describes pain as sore and aching sensation, currently rates as 8 out of 10. Some relief of pain with home exercise regimen, rest and use of medications. Some relief with Voltaren gel and Meloxicam. Lumbar MRI imaging from 2019 exhibits posterior lumbar interbody fusion at L4-L5 without failure or Complication, mild lumbar spondylosis. History of L4-L5 PLIF in 2015 by Dr. Annell Greening. Patient underwent multiple lumbar epidural steroid injections in our office over the years, most recent was right L3 transforaminal epidural steroid on 09/15/2020. She reports significant relief of pain with these injections. Patient retired in 2023, she does well managing chronic back issues at home with regular physical activity and medications as needed. She reports intolerance to opioid medications due to dizziness/drowsiness. Patient denies focal weakness, numbness and tingling. No recent trauma or falls. "  PMH: L4-L5 PLIF, chronic LBP, arthritis PAIN:  Are you having pain? Yes: NPRS scale: 6-7/10 Pain location: R buttock Pain description: toothache Aggravating factors: prolonged sitting  Relieving factors: walking  PRECAUTIONS: None  WEIGHT BEARING  RESTRICTIONS: No  FALLS:  Has patient fallen in last 6 months? No  LIVING ENVIRONMENT: Lives with: lives with their spouse Lives in: House/apartment Stairs: Yes: Internal: 13 steps; on left going up Has following equipment at home: None  OCCUPATION: retired  PLOF: Independent and Leisure: walks Secretary/administrator 1+ mile daily  PATIENT GOALS: get rid of pain  NEXT MD VISIT: none scheduled, to return if does not improve  OBJECTIVE:   DIAGNOSTIC FINDINGS:  06/26/2018 XR Lumbar Spine  AP lateral lumbar spine x-rays are obtained and reviewed.  This shows L4-5  fusion good position of cage graft and hardware without loosening.  I do not see bone bridging across the disc space but there is fusion across the facets.  L3-4 shows disc space narrowing 50% with 2 mm retrolisthesis.   Impression: Solid L4-5 fusion with adjacent level disc space narrowing L3-4 and 2 mm retrolisthesis.   PATIENT SURVEYS:  Modified Oswestry 17/50= 34% disability    SCREENING FOR RED FLAGS: Bowel or bladder incontinence: No Spinal tumors: No Cauda equina syndrome: No Compression fracture: No Abdominal aneurysm: No  COGNITION: Overall cognitive status: Within functional limits for tasks assessed     SENSATION: WFL  MUSCLE LENGTH: Hamstrings: Right ~60 deg; Left ~ 75 deg - moderate tightness bil   POSTURE: decreased lumbar lordosis  PALPATION: Tenderness R SIJ, R glut med, R piriformis.   LUMBAR ROM:  WNL, but increased pressure/pull R side with flexion, extension, flexion and rotation.   LOWER EXTREMITY ROM:   More hip IR than ER, but otherwise pain free hip ROM, WNL  LOWER EXTREMITY MMT:  5/5 all myotomes bil    LUMBAR SPECIAL TESTS:  Straight leg raise test: Positive and FABER test: Negative  GAIT: Distance walked: 78' Assistive device utilized: None Level of assistance: Complete Independence Comments: no significant deviation  TODAY'S TREATMENT:  DATE:   Manual Therapy: to decrease muscle spasm and pain and improve mobility STM/TPR to R glutes, piriformis, skilled palpation and monitoring during dry needling. Trigger Point Dry-Needling  Treatment instructions: Expect mild to moderate muscle soreness. S/S of pneumothorax if dry needled over a lung field, and to seek immediate medical attention should they occur. Patient verbalized understanding of these instructions and education. Patient Consent Given: Yes Education handout provided: Yes Muscles treated: R glut med, R glut max, R piriformis Electrical stimulation performed: No Parameters: N/A Treatment response/outcome: Twitch Response Elicited and Palpable Increase in Muscle Length  Therapeutic Exercise: to improve strength and mobility.  Demo, verbal and tactile cues throughout for technique. Seated piriformis and figure 4 stretches on L side.  Self Care: Findings, POC, discussion of elliptical (wait until next session to try here please).    PATIENT EDUCATION:  Education details: see self care, TrDN Person educated: Patient Education method: Explanation, Demonstration, and Handouts Education comprehension: verbalized understanding and returned demonstration  HOME EXERCISE PROGRAM: TBD  ASSESSMENT:  CLINICAL IMPRESSION: Patient is a 72 y.o. female who was seen today for physical therapy evaluation and treatment for lumbar radiculopathy.  Ignacia Felling demonstrated significant spasm in her R glutes and piriformis and tenderness over R SIJ today.  Also noted significant tightness in her hamstrings.  After explanation of DN rational, procedures, outcomes and potential side effects, patient verbalized consent to DN treatment in conjunction with manual STM/DTM and TPR to reduce ttp/muscle tension. Muscles treated as indicated above. DN produced normal response with good twitches elicited  resulting in palpable reduction in pain/ttp and muscle tension, with patient noting less pain upon initiation of movement following DN. Pt educated to expect mild to moderate muscle soreness for up to 24-48 hrs and instructed to continue prescribed home exercise program and current activity level with pt verbalizing understanding of theses instructions.   Ignacia Felling would benefit from skilled physical therapy to decrease spasm and pain and improve QOL.     OBJECTIVE IMPAIRMENTS: decreased activity tolerance, decreased ROM, increased fascial restrictions, increased muscle spasms, impaired flexibility, and pain.   ACTIVITY LIMITATIONS: bending and sitting  PARTICIPATION LIMITATIONS: driving  PERSONAL FACTORS: Past/current experiences and 1 comorbidity: chronic LBP  are also affecting patient's functional outcome.   REHAB POTENTIAL: Excellent  CLINICAL DECISION MAKING: Stable/uncomplicated  EVALUATION COMPLEXITY: Low   GOALS: Goals reviewed with patient? Yes  SHORT TERM GOALS: Target date: 07/20/2022   Patient will be independent with initial HEP.  Baseline:  Goal status: INITIAL    LONG TERM GOALS: Target date: 08/17/2022    Patient will be independent with advanced/ongoing HEP to improve outcomes and carryover.  Baseline:  Goal status: INITIAL  2.  Patient will report 75% improvement in low back pain to improve QOL.  Baseline:  Goal status: INITIAL  3.  Patient will demonstrate full pain free lumbar ROM to perform ADLs.   Baseline: tightness in R side with lumbar AROM Goal status: INITIAL  4.  Patient will report 11/50 or less on modified Oswestry to demonstrate improved functional ability.  Baseline: 17/50 Goal status: INITIAL   5.  Patient will tolerate 60 min of sitting without increased pain in order to travel.  Baseline: sitting increases pain.  Goal status: INITIAL   PLAN:  PT FREQUENCY: 1-2x/week  PT DURATION: 6 weeks  PLANNED INTERVENTIONS:  Therapeutic exercises, Therapeutic activity, Neuromuscular re-education, Balance training, Gait training, Patient/Family education, Self Care, Joint mobilization, Joint manipulation, Orthotic/Fit training, Dry Needling, Electrical stimulation,  Spinal mobilization, Cryotherapy, Moist heat, Taping, Traction, Ultrasound, Manual therapy, and Re-evaluation.  PLAN FOR NEXT SESSION: stretching for hamstrings, piriformis, needs HEP, core strengthening exercises, manual therapy, TrDN   Jena Gauss, PT, DPT  07/06/2022, 12:16 PM

## 2022-07-06 ENCOUNTER — Ambulatory Visit: Payer: Medicare HMO | Attending: Physical Medicine and Rehabilitation | Admitting: Physical Therapy

## 2022-07-06 ENCOUNTER — Encounter: Payer: Self-pay | Admitting: Physical Therapy

## 2022-07-06 DIAGNOSIS — M7918 Myalgia, other site: Secondary | ICD-10-CM | POA: Diagnosis not present

## 2022-07-06 DIAGNOSIS — M961 Postlaminectomy syndrome, not elsewhere classified: Secondary | ICD-10-CM | POA: Insufficient documentation

## 2022-07-06 DIAGNOSIS — M5416 Radiculopathy, lumbar region: Secondary | ICD-10-CM | POA: Insufficient documentation

## 2022-07-06 DIAGNOSIS — M5459 Other low back pain: Secondary | ICD-10-CM | POA: Diagnosis present

## 2022-07-06 DIAGNOSIS — R252 Cramp and spasm: Secondary | ICD-10-CM | POA: Diagnosis present

## 2022-07-06 NOTE — Patient Instructions (Signed)

## 2022-07-12 ENCOUNTER — Ambulatory Visit: Payer: Medicare HMO | Attending: Physical Medicine and Rehabilitation

## 2022-07-12 DIAGNOSIS — R252 Cramp and spasm: Secondary | ICD-10-CM | POA: Insufficient documentation

## 2022-07-12 DIAGNOSIS — M5459 Other low back pain: Secondary | ICD-10-CM | POA: Diagnosis present

## 2022-07-12 NOTE — Therapy (Addendum)
OUTPATIENT PHYSICAL THERAPY TREATMENT   Patient Name: Annette Jefferson MRN: [id number] DOB:04/15/1950, 72 y.o., female Today's Date: 07/12/2022  END OF SESSION:  PT End of Session - 07/12/22 1054     Visit Number 2    Number of Visits 12    Date for PT Re-Evaluation 08/17/22    Authorization Type [employer] MCR    Authorization Time Period pending authorization    Progress Note Due on Visit 10    PT Start Time 1018    PT Stop Time 1102    PT Time Calculation (min) 44 min    Activity Tolerance Patient tolerated treatment well    Behavior During Therapy WFL for tasks assessed/performed              Past Medical History:  Diagnosis Date   Arthritis    GERD (gastroesophageal reflux disease)    Hyperlipemia    Hypertension    PONV (postoperative nausea and vomiting)    last time 2005   Past Surgical History:  Procedure Laterality Date   BREAST SURGERY Bilateral    cyst removed   buniectomy Left 2005   Buninectomy Right 2013   COLONOSCOPY     Patient Active Problem List   Diagnosis Date Noted   Arthritis of carpometacarpal [facility]) joint of left thumb 06/02/2021   History of lumbar spinal fusion 07/02/2017   Protrusion of lumbar intervertebral disc 07/02/2017   Spinal stenosis of lumbar region 08/22/2013    PCP: Kallie Locks, FNP   REFERRING PROVIDER: Juanda Chance, NP   REFERRING DIAG: M54.16 (ICD-10-CM) - Lumbar radiculopathy M79.18 (ICD-10-CM) - Myofascial pain syndrome M96.1 (ICD-10-CM) - Post laminectomy syndrome  Rationale for Evaluation and Treatment: Rehabilitation  THERAPY DIAG:  Other low back pain  Cramp and spasm  ONSET DATE: 05/15/2022  SUBJECTIVE:                                                                                                                                                                                           SUBJECTIVE STATEMENT: Pt notes pain in R hip today like a toothache.  PERTINENT HISTORY:  From MD note  "HPI: Annette Jefferson is a 72 y.o. female who comes in today for evaluation of acute on chronic right lower back pain radiating to buttock, hip and right posterolateral leg to knee. Patient [place] 05/15/2022 when pain started. Pain worsens with movement, activity and prolonged sitting. She describes pain as sore and aching sensation, currently rates as 8 out of 10. Some relief of pain with home exercise regimen, rest and use of medications. Some  relief with Voltaren gel and Meloxicam. Lumbar MRI imaging from 2019 exhibits posterior lumbar interbody fusion at L4-L5 without failure or Complication, mild lumbar spondylosis. History of L4-L5 PLIF in 2015 by Dr. Annell Greening. Patient underwent multiple lumbar epidural steroid injections in our office over the years, most recent was right L3 transforaminal epidural steroid on 09/15/2020. She reports significant relief of pain with these injections. Patient retired in 2023, she does well managing chronic back issues at home with regular physical activity and medications as needed. She reports intolerance to opioid medications due to dizziness/drowsiness. Patient denies focal weakness, numbness and tingling. No recent trauma or falls. "  PMH: L4-L5 PLIF, chronic LBP, arthritis PAIN:  Are you having pain? Yes: NPRS scale: 7/10 Pain location: R buttock Pain description: toothache Aggravating factors: prolonged sitting  Relieving factors: walking  PRECAUTIONS: None  WEIGHT BEARING RESTRICTIONS: No  FALLS:  Has patient fallen in last 6 months? No  LIVING ENVIRONMENT: Lives with: lives with their spouse Lives in: House/apartment Stairs: Yes: Internal: 13 steps; on left going up Has following equipment at home: None  OCCUPATION: retired  PLOF: Independent and Leisure: walks Secretary/administrator 1+ mile daily  PATIENT GOALS: get rid of pain  NEXT MD VISIT: none scheduled, to return if does not improve  OBJECTIVE:    DIAGNOSTIC FINDINGS:  06/26/2018 XR Lumbar Spine  AP lateral lumbar spine x-rays are obtained and reviewed.  This shows L4-5  fusion good position of cage graft and hardware without loosening.  I do not see bone bridging across the disc space but there is fusion across the facets.  L3-4 shows disc space narrowing 50% with 2 mm retrolisthesis.   Impression: Solid L4-5 fusion with adjacent level disc space narrowing L3-4 and 2 mm retrolisthesis.   PATIENT SURVEYS:  Modified Oswestry 17/50= 34% disability    SCREENING FOR RED FLAGS: Bowel or bladder incontinence: No Spinal tumors: No Cauda equina syndrome: No Compression fracture: No Abdominal aneurysm: No  COGNITION: Overall cognitive status: Within functional limits for tasks assessed     SENSATION: WFL  MUSCLE LENGTH: Hamstrings: Right ~60 deg; Left ~ 75 deg - moderate tightness bil   POSTURE: decreased lumbar lordosis  PALPATION: Tenderness R SIJ, R glut med, R piriformis.   LUMBAR ROM:  WNL, but increased pressure/pull R side with flexion, extension, flexion and rotation.   LOWER EXTREMITY ROM:   More hip IR than ER, but otherwise pain free hip ROM, WNL  LOWER EXTREMITY MMT:  5/5 all myotomes bil    LUMBAR SPECIAL TESTS:  Straight leg raise test: Positive and FABER test: Negative  GAIT: Distance walked: 46' Assistive device utilized: None Level of assistance: Complete Independence Comments: no significant deviation  TODAY'S TREATMENT:                                                                                                                              DATE:  07/12/22 Therapeutic Exercise: to  improve strength and mobility.  Demo, verbal and tactile cues throughout for technique.  Nustep L3x38min Supine R KTOS and figure stretch 2x30 seconds Supine hamstring stretch 2x30 seconds Supine bridges 2x10 Supine LTR x 10 bil Standing lumbar extension x 10 Standing shoulder extension red TB 2x10  Manual Therapy:  to decrease muscle spasm and pain and improve mobility STM/TPR to R glutes, piriformis, skilled palpation and monitoring during dry needling. Trigger Point Dry-Needling  Treatment instructions: Expect mild to moderate muscle soreness. S/S of pneumothorax if dry needled over a lung field, and to seek immediate medical attention should they occur. Patient verbalized understanding of these instructions and education. Patient Consent Given: Yes Education handout provided: Yes Muscles treated: R glut med, R glut max, R piriformis Electrical stimulation performed: No Parameters: N/A Treatment response/outcome: Twitch Response Elicited and Palpable Increase in Muscle Length  Therapeutic Exercise: to improve strength and mobility.  Demo, verbal and tactile cues throughout for technique. Seated piriformis and figure 4 stretches on L side.  Self Care: Findings, POC, discussion of elliptical (wait until next session to try here please).    PATIENT EDUCATION:  Education details: see self care, TrDN Person educated: Patient Education method: Explanation, Demonstration, and Handouts Education comprehension: verbalized understanding and returned demonstration  HOME EXERCISE PROGRAM: Access Code: [email or web address] URL: [email or web address] Date: 07/12/2022 Prepared by: Verta Ellen  Exercises - Seated Piriformis Stretch with Trunk Bend  - 1 x daily - 7 x weekly - 3 sets - 3 reps - 30 seconds hold - Supine Bridge  - 1 x daily - 7 x weekly - 3 sets - 10 reps - Supine Lower Trunk Rotation  - 1 x daily - 7 x weekly - 3 sets - 10 reps - Standing Lumbar Extension  - 1 x daily - 7 x weekly - 3 sets - 10 reps  ASSESSMENT:  CLINICAL IMPRESSION: Pt was able to complete all interventions. Initiated some lumbopelvic mobility and strengthening exercises. She noted the exercises improved her mobility. Had no pain with any of the interventions. Shows good benefit from skilled therapy thus far.      OBJECTIVE IMPAIRMENTS: decreased activity tolerance, decreased ROM, increased fascial restrictions, increased muscle spasms, impaired flexibility, and pain.   ACTIVITY LIMITATIONS: bending and sitting  PARTICIPATION LIMITATIONS: driving  PERSONAL FACTORS: Past/current experiences and 1 comorbidity: chronic LBP  are also affecting patient's functional outcome.   REHAB POTENTIAL: Excellent  CLINICAL DECISION MAKING: Stable/uncomplicated  EVALUATION COMPLEXITY: Low   GOALS: Goals reviewed with patient? Yes  SHORT TERM GOALS: Target date: 07/20/2022   Patient will be independent with initial HEP.  Baseline:  Goal status: IN PROGRESS    LONG TERM GOALS: Target date: 08/17/2022    Patient will be independent with advanced/ongoing HEP to improve outcomes and carryover.  Baseline:  Goal status: IN PROGRESS  2.  Patient will report 75% improvement in low back pain to improve QOL.  Baseline:  Goal status: IN PROGRESS  3.  Patient will demonstrate full pain free lumbar ROM to perform ADLs.   Baseline: tightness in R side with lumbar AROM Goal status: IN PROGRESS  4.  Patient will report 11/50 or less on modified Oswestry to demonstrate improved functional ability.  Baseline: 17/50 Goal status: IN PROGRESS   5.  Patient will tolerate 60 min of sitting without increased pain in order to travel.  Baseline: sitting increases pain.  Goal status: IN PROGRESS   PLAN:  PT FREQUENCY: 1-2x/week  PT DURATION: 6 weeks  PLANNED INTERVENTIONS: Therapeutic exercises, Therapeutic activity, Neuromuscular re-education, Balance training, Gait training, Patient/Family education, Self Care, Joint mobilization, Joint manipulation, Orthotic/Fit training, Dry Needling, Electrical stimulation, Spinal mobilization, Cryotherapy, Moist heat, Taping, Traction, Ultrasound, Manual therapy, and Re-evaluation.  PLAN FOR NEXT SESSION: stretching for hamstrings, piriformis, needs HEP, core strengthening  exercises, manual therapy, TrDN   Mida Cory L Nashla Althoff, PTA 07/12/2022, 11:14 AM   PHYSICAL THERAPY DISCHARGE SUMMARY  Visits from Start of Care: 2  Current functional level related to goals / functional outcomes: See above   Remaining deficits: See above   Education / Equipment: HEP  Plan: Patient agrees to discharge. Patient is being discharged due to cancelling remaining visits.  She reported she was doing well and wanted to be placed on hold, she has not been seen since 07/12/2022 and would need new order to resume physical therapy.    Jena Gauss, PT  09/04/2022 11:59 AM

## 2022-07-14 ENCOUNTER — Ambulatory Visit: Payer: Medicare HMO | Admitting: Physical Therapy

## 2022-07-17 ENCOUNTER — Ambulatory Visit: Payer: Medicare HMO | Admitting: Physical Therapy

## 2022-07-18 ENCOUNTER — Ambulatory Visit: Payer: Medicare HMO | Admitting: Physical Medicine and Rehabilitation

## 2022-07-20 ENCOUNTER — Encounter: Payer: Medicare HMO | Admitting: Physical Therapy

## 2022-07-27 ENCOUNTER — Encounter: Payer: Medicare HMO | Admitting: Physical Therapy

## 2022-08-03 ENCOUNTER — Ambulatory Visit: Payer: Medicare HMO | Admitting: Physical Therapy

## 2022-08-15 ENCOUNTER — Ambulatory Visit: Payer: Medicare HMO | Admitting: Orthopaedic Surgery

## 2022-08-15 DIAGNOSIS — M1812 Unilateral primary osteoarthritis of first carpometacarpal joint, left hand: Secondary | ICD-10-CM | POA: Diagnosis not present

## 2022-08-15 NOTE — Progress Notes (Signed)
Office Visit Note   Patient: Annette Jefferson           Date of Birth: [birth year]           MRN: [id number] Visit Date: 08/15/2022              Requested by: Kallie Locks, FNP [id number] [place],  [place] [address] PCP: Kallie Locks, FNP   Assessment & Plan: Visit Diagnoses:  1. Arthritis of carpometacarpal (CMC) joint of left thumb     Plan: Patient has arthritis left thumb CMC joint with MP hyperextension and laxity symptomatic.  Will have her see Dr. Fara Boros in early August so she can discuss with him surgical options that are available to her.  Follow-Up Instructions: No follow-ups on file.   Orders:  No orders of the defined types were placed in this encounter.  No orders of the defined types were placed in this encounter.     Procedures: No procedures performed   Clinical Data: No additional findings.   Subjective: Chief Complaint  Patient presents with   Left Hand - Pain    HPI 4 female had previous 1 level lumbar surgery by me 2015 returns and is here about her left thumb she is retired but worked doing Engineer, manufacturing systems for many years.  She is right-hand dominant does not recall any specific injury to her left thumb but has arthritis at base of the thumb.  Previous injection last year by Zonia Kief, PA-the gave her relief for a couple months and then recurrent symptoms.  She has problems holding kitchen appliances such as a frying pan other objects opening drawers jars etc.  She states she came here because she wanted to know Recommend she see about her thumb problems.  Review of Systems all other systems noncontributory to HPI.  Lumbar 1 level fusion 08/22/2013 doing well.   Objective: Vital Signs: There were no vitals taken for this visit.  Physical Exam Constitutional:      Appearance: She is well-developed.  HENT:     Head: Normocephalic.     Right Ear: External ear normal.     Left Ear: External ear normal. There is no  impacted cerumen.  Eyes:     Pupils: Pupils are equal, round, and reactive to light.  Neck:     Thyroid: No thyromegaly.     Trachea: No tracheal deviation.  Cardiovascular:     Rate and Rhythm: Normal rate.  Pulmonary:     Effort: Pulmonary effort is normal.  Abdominal:     Palpations: Abdomen is soft.  Musculoskeletal:     Cervical back: No rigidity.  Skin:    General: Skin is warm and dry.  Neurological:     Mental Status: She is alert and oriented to person, place, and time.  Psychiatric:        Behavior: Behavior normal.     Ortho Exam patient has hyperextension of the MP joint left thumb not on the right.  Laxity ulnar collateral ligament positive grind test left first CMC joint.  Sensation to the thumb is intact.  Normal capillary refill.  Specialty Comments:  Narrative & Impression CLINICAL DATA:  Right-sided low back pain for 3-4 weeks. No known injury.   EXAM: MRI LUMBAR SPINE WITHOUT AND WITH CONTRAST   TECHNIQUE: Multiplanar and multiecho pulse sequences of the lumbar spine were obtained without and with intravenous contrast.   CONTRAST:  18mL MULTIHANCE GADOBENATE DIMEGLUMINE 529 MG/ML IV  SOLN   COMPARISON:  None.   FINDINGS: Segmentation:  Standard.   Alignment:  Physiologic.   Vertebrae:  No fracture, evidence of discitis, or bone lesion.   Conus medullaris and cauda equina: Conus extends to the T12 level. Conus and cauda equina appear normal.   Paraspinal and other soft tissues: No acute paraspinal abnormality.   Disc levels:   Disc spaces: Degenerative disc disease with disc desiccation and mild disc height loss at L2-3 and L3-4. Posterior lumbar interbody fusion at L4-5.   T11-12: Small left paracentral disc protrusion.   T12-L1: No significant disc bulge. No evidence of neural foraminal stenosis. No central canal stenosis.   L1-L2: Minimal broad-based disc bulge. No evidence of neural foraminal stenosis. No central canal stenosis.    L2-L3: Mild broad-based disc bulge. No evidence of neural foraminal stenosis. No central canal stenosis.   L3-L4: Mild broad-based disc bulge. Mild bilateral foraminal stenosis. Mild spinal stenosis.   L4-L5: Interbody fusion. No evidence of neural foraminal stenosis. No central canal stenosis.   L5-S1: No significant disc bulge. No evidence of neural foraminal stenosis. No central canal stenosis. Moderate right facet arthropathy.   IMPRESSION: 1. Mild lumbar spine spondylosis as described above. 2. Posterior lumbar interbody fusion at L4-5 without failure or complication.     Electronically Signed   By: Elige Ko   On: 06/11/2017 12:24  Imaging: No results found.   PMFS History: Patient Active Problem List   Diagnosis Date Noted   Arthritis of carpometacarpal [facility]) joint of left thumb 06/02/2021   History of lumbar spinal fusion 07/02/2017   Protrusion of lumbar intervertebral disc 07/02/2017   Spinal stenosis of lumbar region 08/22/2013   Past Medical History:  Diagnosis Date   Arthritis    GERD (gastroesophageal reflux disease)    Hyperlipemia    Hypertension    PONV (postoperative nausea and vomiting)    last time 2005    No family history on file.  Past Surgical History:  Procedure Laterality Date   BREAST SURGERY Bilateral    cyst removed   buniectomy Left 2005   Buninectomy Right 2013   COLONOSCOPY     Social History   Occupational History   Not on file  Tobacco Use   Smoking status: Never   Smokeless tobacco: Never  Substance and Sexual Activity   Alcohol use: No   Drug use: No   Sexual activity: Not on file

## 2022-09-11 ENCOUNTER — Ambulatory Visit: Payer: Medicare HMO | Admitting: Orthopedic Surgery

## 2022-09-11 ENCOUNTER — Other Ambulatory Visit: Payer: Self-pay | Admitting: Physical Medicine and Rehabilitation

## 2022-09-11 ENCOUNTER — Other Ambulatory Visit (INDEPENDENT_AMBULATORY_CARE_PROVIDER_SITE_OTHER): Payer: Medicare HMO

## 2022-09-11 DIAGNOSIS — M1812 Unilateral primary osteoarthritis of first carpometacarpal joint, left hand: Secondary | ICD-10-CM

## 2022-09-11 NOTE — Progress Notes (Signed)
Annette Jefferson - 72 y.o. female MRN [id number]  Date of birth: Jun 17, 1950  Office Visit Note: Visit Date: 09/11/2022 PCP: Kallie Locks, FNP Referred by: Kallie Locks, FNP  Subjective: Chief Complaint  Patient presents with   Left Hand - Pain   HPI: Annette Jefferson is a pleasant 72 y.o. female who presents today for discussion regarding ongoing left thumb CMC osteoarthritis.  She was seen most recently by Dr. Ophelia Charter in July of this year, who placed referral to myself for discussion regarding her ongoing left thumb CMC osteoarthritis.  She has trialed bracing, injections and medications without lasting relief.  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   Assessment & Plan: Visit Diagnoses:  1. Arthritis of carpometacarpal (CMC) joint of left thumb     Plan: Extensive discussion was had with the patient today regarding her ongoing left thumb CMC osteoarthritis with associated MP hyperextension.  Given her symptoms refractory to conservative care, she is indicated for left thumb CMC arthroplasty with internal brace suspensionplasty and MP capsulodesis.    Risks and benefits of the procedure were discussed, risks including but not limited to infection, bleeding, scarring, stiffness, nerve injury, vascular injury, hardware complication, recurrence of symptoms and need for subsequent operation.  Patient expressed understanding.  Will move forward with surgical scheduling for the next available date.    Follow-up: No follow-ups on file.   Meds & Orders: No orders of the defined types were placed in this encounter.   Orders Placed This Encounter   Procedures   XR Wrist 2 Views Left     Procedures: No procedures performed      Clinical History: Narrative & Impression CLINICAL DATA:  Right-sided low back pain for 3-4 weeks. No known injury.   EXAM: MRI LUMBAR SPINE WITHOUT AND WITH CONTRAST   TECHNIQUE: Multiplanar and multiecho pulse sequences of the lumbar spine were obtained without and with intravenous contrast.   CONTRAST:  18mL MULTIHANCE GADOBENATE DIMEGLUMINE 529 MG/ML IV SOLN   COMPARISON:  None.   FINDINGS: Segmentation:  Standard.   Alignment:  Physiologic.   Vertebrae:  No fracture, evidence of discitis, or bone lesion.   Conus medullaris and cauda equina: Conus extends to the T12 level. Conus and cauda equina appear normal.   Paraspinal and other soft tissues: No acute paraspinal abnormality.   Disc levels:   Disc spaces: Degenerative disc disease with disc desiccation and mild disc height loss at L2-3 and L3-4. Posterior lumbar interbody fusion at L4-5.   T11-12: Small left paracentral disc protrusion.   T12-L1: No significant disc bulge. No evidence of neural foraminal stenosis. No central canal stenosis.   L1-L2: Minimal broad-based disc bulge. No evidence of neural foraminal stenosis. No central canal stenosis.   L2-L3: Mild broad-based disc bulge. No evidence of neural foraminal stenosis. No central canal stenosis.   L3-L4: Mild broad-based disc bulge. Mild bilateral foraminal stenosis. Mild spinal stenosis.   L4-L5: Interbody fusion. No evidence of neural foraminal stenosis. No central canal stenosis.   L5-S1: No significant disc bulge. No evidence of neural foraminal stenosis. No central canal stenosis. Moderate right facet arthropathy.   IMPRESSION: 1. Mild lumbar spine spondylosis as described above. 2. Posterior lumbar  interbody fusion at L4-5 without failure or complication.     Electronically Signed   By: Elige Ko   On: 06/11/2017 12:24  She reports that she  has never smoked. She has never used smokeless tobacco. No results for input(s): "HGBA1C", "LABURIC" in the last 8760 hours.  Objective:   Vital Signs: There were no vitals taken for this visit.  Physical Exam  Gen: Well-appearing, in no acute distress; non-toxic CV: Regular Rate. Well-perfused. Warm.  Resp: Breathing unlabored on room air; no wheezing. Psych: Fluid speech in conversation; appropriate affect; normal thought process Neuro: Sensation intact throughout. No gross coordination deficits.   Ortho Exam PHYSICAL EXAM:  General: Patient is well appearing and in no distress. Cervical spine mobility is full in all directions:  Skin and Muscle: No skin changes are apparent to upper extremities.  Muscle bulk and contour normal, no signs of atrophy.     Range of Motion and Palpation Tests: Mobility is full about the elbows with flexion and extension.  Forearm supination and pronation are 85/85 bilaterally.  Wrist flexion/extension is 75/65 bilaterally.  Digital flexion and extension are full.  Thumb opposition is full to the base of the small fingers bilaterally.    No cords or nodules are palpated.  No triggering is observed.    Significant tenderness over the left thumb CMC articulations is observed, positive grind, positive crepitus.  There is associated MP hyperextension approximately 40 degrees.   Finklestein test is positive left side.  Neurologic, Vascular, Motor: Sensation is intact to light touch in the median/radial/ulnar distributions.  Tinel's testing negative at cubital and carpal tunnel.  Two point discrimination is intact.  Phalen's negative, Derkan's compression negative Fingers pink and well perfused.  Capillary refill is brisk.      No results found for: "HGBA1C"   Imaging: XR Wrist 2 Views Left  Result Date: 09/11/2022 Left wrist with [place] view was taken today X-rays demonstrate significant osteoarthritic changes at the left thumb [facility] joint with joint  space subluxation, space narrowing, osteophyte formation and subchondral sclerosis.   Past Medical/Family/Surgical/Social History: Medications & Allergies reviewed per EMR, new medications updated. Patient Active Problem List   Diagnosis Date Noted   Arthritis of carpometacarpal [facility]) joint of left thumb 06/02/2021   History of lumbar spinal fusion 07/02/2017   Protrusion of lumbar intervertebral disc 07/02/2017   Spinal stenosis of lumbar region 08/22/2013   Past Medical History:  Diagnosis Date   Arthritis    GERD (gastroesophageal reflux disease)    Hyperlipemia    Hypertension    PONV (postoperative nausea and vomiting)    last time 2005   No family history on file. Past Surgical History:  Procedure Laterality Date   BREAST SURGERY Bilateral    cyst removed   buniectomy Left 2005   Buninectomy Right 2013   COLONOSCOPY     Social History   Occupational History   Not on file  Tobacco Use   Smoking status: Never   Smokeless tobacco: Never  Substance and Sexual Activity   Alcohol use: No   Drug use: No   Sexual activity: Not on file     Trevor Mace, M.D. [facility] 3:27 PM

## 2022-09-11 NOTE — H&P (View-Only) (Signed)
 Adalinn Crochet - 72 y.o. female MRN [id number]  Date of birth: Jun 17, 1950  Office Visit Note: Visit Date: 09/11/2022 PCP: Kallie Locks, FNP Referred by: Kallie Locks, FNP  Subjective: Chief Complaint  Patient presents with   Left Hand - Pain   HPI: Zeola Kincaid is a pleasant 72 y.o. female who presents today for discussion regarding ongoing left thumb CMC osteoarthritis.  She was seen most recently by Dr. Ophelia Charter in July of this year, who placed referral to myself for discussion regarding her ongoing left thumb CMC osteoarthritis.  She has trialed bracing, injections and medications without lasting relief.  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   Assessment & Plan: Visit Diagnoses:  1. Arthritis of carpometacarpal (CMC) joint of left thumb     Plan: Extensive discussion was had with the patient today regarding her ongoing left thumb CMC osteoarthritis with associated MP hyperextension.  Given her symptoms refractory to conservative care, she is indicated for left thumb CMC arthroplasty with internal brace suspensionplasty and MP capsulodesis.    Risks and benefits of the procedure were discussed, risks including but not limited to infection, bleeding, scarring, stiffness, nerve injury, vascular injury, hardware complication, recurrence of symptoms and need for subsequent operation.  Patient expressed understanding.  Will move forward with surgical scheduling for the next available date.    Follow-up: No follow-ups on file.   Meds & Orders: No orders of the defined types were placed in this encounter.   Orders Placed This Encounter   Procedures   XR Wrist 2 Views Left     Procedures: No procedures performed      Clinical History: Narrative & Impression CLINICAL DATA:  Right-sided low back pain for 3-4 weeks. No known injury.   EXAM: MRI LUMBAR SPINE WITHOUT AND WITH CONTRAST   TECHNIQUE: Multiplanar and multiecho pulse sequences of the lumbar spine were obtained without and with intravenous contrast.   CONTRAST:  18mL MULTIHANCE GADOBENATE DIMEGLUMINE 529 MG/ML IV SOLN   COMPARISON:  None.   FINDINGS: Segmentation:  Standard.   Alignment:  Physiologic.   Vertebrae:  No fracture, evidence of discitis, or bone lesion.   Conus medullaris and cauda equina: Conus extends to the T12 level. Conus and cauda equina appear normal.   Paraspinal and other soft tissues: No acute paraspinal abnormality.   Disc levels:   Disc spaces: Degenerative disc disease with disc desiccation and mild disc height loss at L2-3 and L3-4. Posterior lumbar interbody fusion at L4-5.   T11-12: Small left paracentral disc protrusion.   T12-L1: No significant disc bulge. No evidence of neural foraminal stenosis. No central canal stenosis.   L1-L2: Minimal broad-based disc bulge. No evidence of neural foraminal stenosis. No central canal stenosis.   L2-L3: Mild broad-based disc bulge. No evidence of neural foraminal stenosis. No central canal stenosis.   L3-L4: Mild broad-based disc bulge. Mild bilateral foraminal stenosis. Mild spinal stenosis.   L4-L5: Interbody fusion. No evidence of neural foraminal stenosis. No central canal stenosis.   L5-S1: No significant disc bulge. No evidence of neural foraminal stenosis. No central canal stenosis. Moderate right facet arthropathy.   IMPRESSION: 1. Mild lumbar spine spondylosis as described above. 2. Posterior lumbar  interbody fusion at L4-5 without failure or complication.     Electronically Signed   By: Elige Ko   On: 06/11/2017 12:24  She reports that she  has never smoked. She has never used smokeless tobacco. No results for input(s): "HGBA1C", "LABURIC" in the last 8760 hours.  Objective:   Vital Signs: There were no vitals taken for this visit.  Physical Exam  Gen: Well-appearing, in no acute distress; non-toxic CV: Regular Rate. Well-perfused. Warm.  Resp: Breathing unlabored on room air; no wheezing. Psych: Fluid speech in conversation; appropriate affect; normal thought process Neuro: Sensation intact throughout. No gross coordination deficits.   Ortho Exam PHYSICAL EXAM:  General: Patient is well appearing and in no distress. Cervical spine mobility is full in all directions:  Skin and Muscle: No skin changes are apparent to upper extremities.  Muscle bulk and contour normal, no signs of atrophy.     Range of Motion and Palpation Tests: Mobility is full about the elbows with flexion and extension.  Forearm supination and pronation are 85/85 bilaterally.  Wrist flexion/extension is 75/65 bilaterally.  Digital flexion and extension are full.  Thumb opposition is full to the base of the small fingers bilaterally.    No cords or nodules are palpated.  No triggering is observed.    Significant tenderness over the left thumb CMC articulations is observed, positive grind, positive crepitus.  There is associated MP hyperextension approximately 40 degrees.   Finklestein test is positive left side.  Neurologic, Vascular, Motor: Sensation is intact to light touch in the median/radial/ulnar distributions.  Tinel's testing negative at cubital and carpal tunnel.  Two point discrimination is intact.  Phalen's negative, Derkan's compression negative Fingers pink and well perfused.  Capillary refill is brisk.      No results found for: "HGBA1C"   Imaging: XR Wrist 2 Views Left  Result Date: 09/11/2022 Left wrist with [place] view was taken today X-rays demonstrate significant osteoarthritic changes at the left thumb [facility] joint with joint  space subluxation, space narrowing, osteophyte formation and subchondral sclerosis.   Past Medical/Family/Surgical/Social History: Medications & Allergies reviewed per EMR, new medications updated. Patient Active Problem List   Diagnosis Date Noted   Arthritis of carpometacarpal [facility]) joint of left thumb 06/02/2021   History of lumbar spinal fusion 07/02/2017   Protrusion of lumbar intervertebral disc 07/02/2017   Spinal stenosis of lumbar region 08/22/2013   Past Medical History:  Diagnosis Date   Arthritis    GERD (gastroesophageal reflux disease)    Hyperlipemia    Hypertension    PONV (postoperative nausea and vomiting)    last time 2005   No family history on file. Past Surgical History:  Procedure Laterality Date   BREAST SURGERY Bilateral    cyst removed   buniectomy Left 2005   Buninectomy Right 2013   COLONOSCOPY     Social History   Occupational History   Not on file  Tobacco Use   Smoking status: Never   Smokeless tobacco: Never  Substance and Sexual Activity   Alcohol use: No   Drug use: No   Sexual activity: Not on file    Anshul Trevor Mace, M.D. [facility] 3:27 PM

## 2022-09-20 ENCOUNTER — Other Ambulatory Visit: Payer: Self-pay

## 2022-09-20 ENCOUNTER — Encounter (HOSPITAL_BASED_OUTPATIENT_CLINIC_OR_DEPARTMENT_OTHER): Payer: Self-pay | Admitting: Orthopedic Surgery

## 2022-09-22 ENCOUNTER — Other Ambulatory Visit: Payer: Self-pay | Admitting: Orthopedic Surgery

## 2022-09-22 ENCOUNTER — Other Ambulatory Visit: Payer: Self-pay

## 2022-09-22 DIAGNOSIS — I10 Essential (primary) hypertension: Secondary | ICD-10-CM | POA: Diagnosis not present

## 2022-09-22 DIAGNOSIS — M1812 Unilateral primary osteoarthritis of first carpometacarpal joint, left hand: Secondary | ICD-10-CM | POA: Diagnosis present

## 2022-09-22 DIAGNOSIS — Z79899 Other long term (current) drug therapy: Secondary | ICD-10-CM | POA: Diagnosis not present

## 2022-09-22 DIAGNOSIS — K219 Gastro-esophageal reflux disease without esophagitis: Secondary | ICD-10-CM | POA: Diagnosis not present

## 2022-09-22 NOTE — Progress Notes (Signed)

## 2022-09-25 DIAGNOSIS — M1812 Unilateral primary osteoarthritis of first carpometacarpal joint, left hand: Secondary | ICD-10-CM | POA: Diagnosis not present

## 2022-09-25 LAB — BASIC METABOLIC PANEL
Anion gap: 10 (ref 5–15)
BUN: 17 mg/dL (ref 8–23)
CO2: 22 mmol/L (ref 22–32)
Calcium: 9.2 mg/dL (ref 8.9–10.3)
Chloride: 108 mmol/L (ref 98–111)
Creatinine, Ser: 0.92 mg/dL (ref 0.44–1.00)
GFR, Estimated: >60 mL/min (ref >60)
Glucose, Bld: 97 mg/dL (ref 70–99)
Potassium: 3.6 mmol/L (ref 3.5–5.1)
Sodium: 140 mmol/L (ref 135–145)

## 2022-09-26 ENCOUNTER — Ambulatory Visit (HOSPITAL_BASED_OUTPATIENT_CLINIC_OR_DEPARTMENT_OTHER): Payer: Medicare HMO | Admitting: Anesthesiology

## 2022-09-26 ENCOUNTER — Encounter (HOSPITAL_BASED_OUTPATIENT_CLINIC_OR_DEPARTMENT_OTHER)
Admission: RE | Disposition: A | Discharge status: Home / Self Care | Payer: Self-pay | Source: Home / Self Care | Attending: Orthopedic Surgery

## 2022-09-26 ENCOUNTER — Ambulatory Visit (HOSPITAL_BASED_OUTPATIENT_CLINIC_OR_DEPARTMENT_OTHER): Payer: Medicare HMO

## 2022-09-26 ENCOUNTER — Ambulatory Visit (HOSPITAL_BASED_OUTPATIENT_CLINIC_OR_DEPARTMENT_OTHER)
Admission: RE | Admit: 2022-09-26 | Discharge: 2022-09-26 | Disposition: A | Discharge status: Home / Self Care | Payer: Medicare HMO | Attending: Orthopedic Surgery | Admitting: Orthopedic Surgery

## 2022-09-26 ENCOUNTER — Encounter (HOSPITAL_BASED_OUTPATIENT_CLINIC_OR_DEPARTMENT_OTHER): Payer: Self-pay | Admitting: Orthopedic Surgery

## 2022-09-26 DIAGNOSIS — M1812 Unilateral primary osteoarthritis of first carpometacarpal joint, left hand: Secondary | ICD-10-CM | POA: Diagnosis not present

## 2022-09-26 DIAGNOSIS — M19032 Primary osteoarthritis, left wrist: Secondary | ICD-10-CM | POA: Diagnosis not present

## 2022-09-26 DIAGNOSIS — Z01818 Encounter for other preprocedural examination: Secondary | ICD-10-CM

## 2022-09-26 DIAGNOSIS — M25532 Pain in left wrist: Secondary | ICD-10-CM

## 2022-09-26 DIAGNOSIS — Z79899 Other long term (current) drug therapy: Secondary | ICD-10-CM | POA: Insufficient documentation

## 2022-09-26 DIAGNOSIS — K219 Gastro-esophageal reflux disease without esophagitis: Secondary | ICD-10-CM | POA: Insufficient documentation

## 2022-09-26 DIAGNOSIS — I10 Essential (primary) hypertension: Secondary | ICD-10-CM

## 2022-09-26 DIAGNOSIS — M25342 Other instability, left hand: Secondary | ICD-10-CM | POA: Diagnosis not present

## 2022-09-26 HISTORY — PX: CARPOMETACARPAL (CMC) FUSION OF THUMB: SHX6290

## 2022-09-26 SURGERY — CARPOMETACARPAL (CMC) FUSION OF THUMB
Anesthesia: Monitor Anesthesia Care | Site: Thumb | Laterality: Left

## 2022-09-26 MED ORDER — OXYCODONE HCL 5 MG/5ML PO SOLN: 5.0000 mg | Freq: Once | ORAL | Status: DC | PRN

## 2022-09-26 MED ORDER — MIDAZOLAM HCL 2 MG/2ML IJ SOLN
INTRAMUSCULAR | Status: AC
  Filled 2022-09-26: qty 2

## 2022-09-26 MED ORDER — FENTANYL CITRATE (PF) 100 MCG/2ML IJ SOLN
INTRAMUSCULAR | Status: AC
  Filled 2022-09-26: qty 2

## 2022-09-26 MED ORDER — CEFAZOLIN SODIUM-DEXTROSE 2-4 GM/100ML-% IV SOLN
2.0000 g | INTRAVENOUS | Status: AC
  Administered 2022-09-26: 2 g via INTRAVENOUS

## 2022-09-26 MED ORDER — LIDOCAINE-EPINEPHRINE (PF) 1.5 %-1:200000 IJ SOLN
INTRAMUSCULAR | Status: DC | PRN
  Administered 2022-09-26: 5 mL via PERINEURAL

## 2022-09-26 MED ORDER — ONDANSETRON HCL 4 MG/2ML IJ SOLN
INTRAMUSCULAR | Status: AC
  Filled 2022-09-26: qty 2

## 2022-09-26 MED ORDER — DEXAMETHASONE SODIUM PHOSPHATE 4 MG/ML IJ SOLN
INTRAMUSCULAR | Status: DC | PRN
  Administered 2022-09-26: 4 mg via INTRAVENOUS

## 2022-09-26 MED ORDER — LACTATED RINGERS IV SOLN: INTRAVENOUS | Status: DC

## 2022-09-26 MED ORDER — ACETAMINOPHEN 500 MG PO TABS: 1000.0000 mg | ORAL_TABLET | Freq: Once | ORAL | Status: DC | PRN

## 2022-09-26 MED ORDER — OXYCODONE HCL 5 MG PO TABS: 5.0000 mg | ORAL_TABLET | Freq: Four times a day (QID) | ORAL | 0 refills | Status: AC | PRN

## 2022-09-26 MED ORDER — PROPOFOL 500 MG/50ML IV EMUL
INTRAVENOUS | Status: DC | PRN
  Administered 2022-09-26: 50 ug/kg/min via INTRAVENOUS

## 2022-09-26 MED ORDER — ONDANSETRON HCL 4 MG/2ML IJ SOLN
INTRAMUSCULAR | Status: DC | PRN
  Administered 2022-09-26: 4 mg via INTRAVENOUS

## 2022-09-26 MED ORDER — DEXAMETHASONE SODIUM PHOSPHATE 10 MG/ML IJ SOLN
INTRAMUSCULAR | Status: AC
  Filled 2022-09-26: qty 1

## 2022-09-26 MED ORDER — MIDAZOLAM HCL 2 MG/2ML IJ SOLN
1.0000 mg | Freq: Once | INTRAMUSCULAR | Status: AC
  Administered 2022-09-26: 1 mg via INTRAVENOUS

## 2022-09-26 MED ORDER — CEFAZOLIN SODIUM-DEXTROSE 2-4 GM/100ML-% IV SOLN
INTRAVENOUS | Status: AC
  Filled 2022-09-26: qty 100

## 2022-09-26 MED ORDER — ACETAMINOPHEN 10 MG/ML IV SOLN: 1000.0000 mg | Freq: Once | INTRAVENOUS | Status: DC | PRN

## 2022-09-26 MED ORDER — EPHEDRINE SULFATE-NACL 50-0.9 MG/10ML-% IV SOSY
PREFILLED_SYRINGE | INTRAVENOUS | Status: DC | PRN
  Administered 2022-09-26: 10 mg via INTRAVENOUS
  Administered 2022-09-26: 5 mg via INTRAVENOUS
  Administered 2022-09-26: 10 mg via INTRAVENOUS

## 2022-09-26 MED ORDER — PROPOFOL 500 MG/50ML IV EMUL
INTRAVENOUS | Status: AC
  Filled 2022-09-26: qty 50

## 2022-09-26 MED ORDER — FENTANYL CITRATE (PF) 100 MCG/2ML IJ SOLN
50.0000 ug | Freq: Once | INTRAMUSCULAR | Status: AC
  Administered 2022-09-26: 50 ug via INTRAVENOUS

## 2022-09-26 MED ORDER — OXYCODONE HCL 5 MG PO TABS: 5.0000 mg | ORAL_TABLET | Freq: Once | ORAL | Status: DC | PRN

## 2022-09-26 MED ORDER — ACETAMINOPHEN 160 MG/5ML PO SOLN: 1000.0000 mg | Freq: Once | ORAL | Status: DC | PRN

## 2022-09-26 MED ORDER — BUPIVACAINE-EPINEPHRINE (PF) 0.5% -1:200000 IJ SOLN
INTRAMUSCULAR | Status: DC | PRN
Start: 2022-09-26 — End: 2022-09-26
  Administered 2022-09-26: 25 mL via PERINEURAL

## 2022-09-26 MED ORDER — FENTANYL CITRATE (PF) 100 MCG/2ML IJ SOLN: 25.0000 ug | INTRAMUSCULAR | Status: DC | PRN

## 2022-09-26 MED ORDER — 0.9 % SODIUM CHLORIDE (POUR BTL) OPTIME
TOPICAL | Status: DC | PRN
  Administered 2022-09-26: 400 mL

## 2022-09-26 SURGICAL SUPPLY — 65 items
ANCH SUT SWVLOCK 8.5 (Anchor) ×2 IMPLANT
ANCHOR DX SWIVELOCK SL 3.5X8.5 (Anchor) IMPLANT
APL PRP 5X4 STRL LF DISP 70% (MISCELLANEOUS) ×1
APPLICATOR CHLORAPREP 3ML ORNG (MISCELLANEOUS) ×1 IMPLANT
BLADE ARTHRO LOK 4 BEAVER (BLADE) ×1 IMPLANT
BLADE AVERAGE 25X9 (BLADE) IMPLANT
BLADE SURG 15 STRL LF DISP TIS (BLADE) ×2 IMPLANT
BLADE SURG 15 STRL SS (BLADE) ×2
BNDG CMPR 5X3 KNIT ELC UNQ LF (GAUZE/BANDAGES/DRESSINGS)
BNDG CMPR 5X4 CHSV STRCH STRL (GAUZE/BANDAGES/DRESSINGS) ×1
BNDG CMPR 5X4 KNIT ELC UNQ LF (GAUZE/BANDAGES/DRESSINGS) ×1
BNDG CMPR 75X21 PLY HI ABS (MISCELLANEOUS) ×1
BNDG CMPR 9X4 STRL LF SNTH (GAUZE/BANDAGES/DRESSINGS) ×1
BNDG COHESIVE 4X5 TAN STRL LF (GAUZE/BANDAGES/DRESSINGS) ×1 IMPLANT
BNDG ELASTIC 3INX 5YD STR LF (GAUZE/BANDAGES/DRESSINGS) IMPLANT
BNDG ELASTIC 4INX 5YD STR LF (GAUZE/BANDAGES/DRESSINGS) ×1 IMPLANT
BNDG ESMARK 4X9 LF (GAUZE/BANDAGES/DRESSINGS) ×1 IMPLANT
BNDG GAUZE DERMACEA FLUFF 4 (GAUZE/BANDAGES/DRESSINGS) ×1 IMPLANT
BNDG GZE DERMACEA 4 6PLY (GAUZE/BANDAGES/DRESSINGS) ×1
CORD BIPOLAR FORCEPS 12FT (ELECTRODE) ×1 IMPLANT
COVER BACK TABLE 60X90IN (DRAPES) ×1 IMPLANT
CUFF TOURN SGL QUICK 18X4 (TOURNIQUET CUFF) IMPLANT
DRAPE EXTREMITY T 121X128X90 (DISPOSABLE) ×1 IMPLANT
DRAPE IMP U-DRAPE 54X76 (DRAPES) ×1 IMPLANT
DRAPE OEC MINIVIEW 54X84 (DRAPES) ×1 IMPLANT
DRAPE U-SHAPE 47X51 STRL (DRAPES) IMPLANT
GAUZE PAD ABD 8X10 STRL (GAUZE/BANDAGES/DRESSINGS) ×1 IMPLANT
GAUZE SPONGE 4X4 12PLY STRL (GAUZE/BANDAGES/DRESSINGS) ×1 IMPLANT
GAUZE STRETCH 2X75IN STRL (MISCELLANEOUS) ×1 IMPLANT
GAUZE XEROFORM 1X8 LF (GAUZE/BANDAGES/DRESSINGS) ×1 IMPLANT
GLOVE BIO SURGEON STRL SZ7.5 (GLOVE) ×1 IMPLANT
GLOVE BIOGEL PI IND STRL 7.5 (GLOVE) ×1 IMPLANT
GOWN STRL REUS W/ TWL LRG LVL3 (GOWN DISPOSABLE) ×2 IMPLANT
GOWN STRL REUS W/TWL LRG LVL3 (GOWN DISPOSABLE) ×2
GOWN STRL REUS W/TWL XL LVL3 (GOWN DISPOSABLE) IMPLANT
KIT SWIVELOCK DX 3.5X8.5 DISP (KITS) IMPLANT
MANIFOLD NEPTUNE II (INSTRUMENTS) ×1 IMPLANT
NDL HYPO 25X1 1.5 SAFETY (NEEDLE) IMPLANT
NDL SUT 6 .5 CRC .975X.05 MAYO (NEEDLE) IMPLANT
NEEDLE HYPO 25X1 1.5 SAFETY (NEEDLE)
NEEDLE MAYO TAPER (NEEDLE)
NS IRRIG 1000ML POUR BTL (IV SOLUTION) IMPLANT
PACK BASIN DAY SURGERY FS (CUSTOM PROCEDURE TRAY) ×1 IMPLANT
PAD CAST 3X4 CTTN HI CHSV (CAST SUPPLIES) ×1 IMPLANT
PAD PREP 24X48 CUFFED NSTRL (MISCELLANEOUS) ×1 IMPLANT
PADDING CAST COTTON 3X4 STRL (CAST SUPPLIES) ×1
SHEET MEDIUM DRAPE 40X70 STRL (DRAPES) ×1 IMPLANT
SPIKE FLUID TRANSFER (MISCELLANEOUS) IMPLANT
SPLINT PLASTER CAST XFAST 4X15 (CAST SUPPLIES) ×1 IMPLANT
SPONGE SURGIFOAM ABS GEL 12-7 (HEMOSTASIS) IMPLANT
STOCKINETTE IMPERVIOUS LG (DRAPES) IMPLANT
SUCTION TUBE FRAZIER 10FR DISP (SUCTIONS) IMPLANT
SUT 0 FIBERLOOP 38 BLUE TPR ND (SUTURE) ×1
SUT ETHIBOND 0 MO6 C/R (SUTURE) IMPLANT
SUT ETHILON 4 0 PS 2 18 (SUTURE) IMPLANT
SUT MNCRL AB 3-0 PS2 27 (SUTURE) IMPLANT
SUT MNCRL AB 4-0 PS2 18 (SUTURE) IMPLANT
SUT VIC AB 3-0 PS2 18 (SUTURE) ×1
SUT VIC AB 3-0 PS2 18XBRD (SUTURE) IMPLANT
SUTURE 0 FIBERLP 38 BLU TPR ND (SUTURE) IMPLANT
SYR BULB EAR ULCER 3OZ GRN STR (SYRINGE) ×1 IMPLANT
SYR CONTROL 10ML LL (SYRINGE) IMPLANT
TAPE SUT LABRALTAP WHT/BLK (SUTURE) IMPLANT
TOWEL GREEN STERILE FF (TOWEL DISPOSABLE) ×2 IMPLANT
TUBE CONNECTING 20X1/4 (TUBING) IMPLANT

## 2022-09-26 NOTE — Progress Notes (Signed)
Assisted Dr. Maple Hudson with left, supraclavicular, ultrasound guided block. Side rails up, monitors on throughout procedure. See vital signs in flow sheet. Tolerated Procedure well.

## 2022-09-26 NOTE — Anesthesia Preprocedure Evaluation (Addendum)
Anesthesia Evaluation  Patient identified by MRN, date of birth, ID band Patient awake    Reviewed: Allergy & Precautions, NPO status , Patient's Chart, lab work & pertinent test results  History of Anesthesia Complications (+) PONV and history of anesthetic complications  Airway Mallampati: III  TM Distance: >3 FB Neck ROM: Full    Dental  (+) Teeth Intact, Dental Advisory Given,    Pulmonary neg pulmonary ROS   breath sounds clear to auscultation       Cardiovascular hypertension, Pt. on medications (-) angina (-) Past MI and (-) CHF  Rhythm:Regular     Neuro/Psych negative neurological ROS  negative psych ROS   GI/Hepatic Neg liver ROS,GERD  Medicated and Controlled,,  Endo/Other  negative endocrine ROS    Renal/GU negative Renal ROS     Musculoskeletal  (+) Arthritis ,   LEFT CMC ARTHRITIS AND MP HYPEREXTENSION   Abdominal   Peds  Hematology negative hematology ROS (+)   Anesthesia Other Findings   Reproductive/Obstetrics                             Anesthesia Physical Anesthesia Plan  ASA: 2  Anesthesia Plan: MAC and Regional   Post-op Pain Management: Regional block*   Induction: Intravenous  PONV Risk Score and Plan: 3 and Ondansetron, Dexamethasone and Propofol infusion  Airway Management Planned: Nasal Cannula, Natural Airway and Simple Face Mask  Additional Equipment: None  Intra-op Plan:   Post-operative Plan:   Informed Consent: I have reviewed the patients History and Physical, chart, labs and discussed the procedure including the risks, benefits and alternatives for the proposed anesthesia with the patient or authorized representative who has indicated his/her understanding and acceptance.     Dental advisory given  Plan Discussed with: CRNA  Anesthesia Plan Comments:        Anesthesia Quick Evaluation

## 2022-09-26 NOTE — Transfer of Care (Signed)
Immediate Anesthesia Transfer of Care Note  Patient: Annette Jefferson  Procedure(s) Performed: LEFT THUMB CMC ARTHROPLASTY WITH INTERNAL BRACE / MP CAPSULODESIS (Left: Thumb)  Patient Location: PACU  Anesthesia Type:MAC combined with regional for post-op pain  Level of Consciousness: awake, alert , and oriented  Airway & Oxygen Therapy: Patient Spontanous Breathing and Patient connected to face mask oxygen  Post-op Assessment: Report given to RN and Post -op Vital signs reviewed and stable  Post vital signs: Reviewed and stable  Last Vitals:  Vitals Value Taken Time  BP 120/71 09/26/22 1021  Temp    Pulse 62 09/26/22 1023  Resp 17 09/26/22 1023  SpO2 97 % 09/26/22 1023  Vitals shown include unfiled device data.  Last Pain:  Vitals:   09/26/22 0659  TempSrc: Temporal  PainSc: 3       Patients Stated Pain Goal: 3 (09/26/22 0659)  Complications: No notable events documented.

## 2022-09-26 NOTE — Anesthesia Procedure Notes (Addendum)
Anesthesia Regional Block: Supraclavicular block   Pre-Anesthetic Checklist: , timeout performed,  Correct Patient, Correct Site, Correct Laterality,  Correct Procedure, Correct Position, site marked,  Risks and benefits discussed,  Surgical consent,  Pre-op evaluation,  At surgeon's request and post-op pain management  Laterality: Right and Upper  Prep: chloraprep       Needles:  Injection technique: Single-shot      Needle Length: 5cm  Needle Gauge: 22     Additional Needles: Arrow StimuQuik ECHO Echogenic Stimulating PNB Needle  Procedures:,,,, ultrasound used (permanent image in chart),,    Narrative:  Start time: 09/26/2022 7:36 AM End time: 09/26/2022 7:41 AM Injection made incrementally with aspirations every 5 mL.  Performed by: Personally  Anesthesiologist: Val Eagle, MD

## 2022-09-26 NOTE — Op Note (Signed)
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   ASSISTANT: None   ANESTHESIA:  Regional with sedation   INTRAVENOUS FLUIDS:  Per anesthesia flow sheet.   ESTIMATED BLOOD LOSS:  Minimal.   COMPLICATIONS:  None.   SPECIMENS:  none   TOURNIQUET TIME:    Total Tourniquet Time Documented: Upper Arm (Left) - 57 minutes Total: Upper Arm (Left) - 57 minutes    DISPOSITION:  Stable to PACU.   INDICATIONS: This is a 72 year old female who was seen in the outpatient setting and found to have ongoing left thumb CMC osteoarthritis with associated MP hyperextension. Extensive discussion was had with the patient regarding her ongoing left thumb CMC osteoarthritis with associated MP hyperextension.  Given her symptoms refractory to conservative care, she was indicated for left thumb CMC arthroplasty with internal brace suspensionplasty and MP capsulodesis.     Risks and benefits of the procedure were discussed, risks including but not limited to infection, bleeding, scarring, stiffness, nerve injury, vascular injury, hardware complication, recurrence of symptoms, thumb subsidence, and need for subsequent operation.  Patient expressed understanding.     OPERATIVE COURSE: Patient was seen and identified in the preoperative area and marked  appropriately.  Surgical consent had been signed. Preoperative IV antibiotic prophylaxis was given. She was transferred to the operating room and placed in supine position with the Left upper extremity on an arm board.  Sedation was induced by the anesthesiologist. A regional block had been performed by anesthesia in preoperative holding.    Left upper extremity was prepped and draped in normal sterile orthopedic fashion.  A surgical pause was performed between the surgeons, anesthesia, and operating room staff and all were in agreement as to the patient, procedure, and site of procedure.  Tourniquet was placed and padded appropriately.  A straight line incision was made over the dorsum of the left thumb CMC joint at the glabrous/nonglabrous junction.  Crossing branches of the radial sensory nerve were identified and carefully protected.  The radial artery was identified and protected.  The first extensor compartment was first released.  The tendons of the first tensor compartment were identified within the incisional site.  The dorsal most aspect of the tendon sheath was then released and carried down over the radial styloid.  Following release of the first extensor compartment, CMC arthrotomy was completed.  Thick capsular flaps were elevated both radially and ulnarly.  The trapezium was identified and confirmed with biplanar fluoroscopy.  This was carefully freed and dissected, then removed utilizing a rongeur.  Care was taken to avoid injury to the underlying flexor carpi radialis tendon.  Following complete trapeziectomy, the scaphoid trapezoid joint was inspected.  There was noted to be cartilage loss and degenerative changes at the joint involving the articular surface of the trapezoid, consequently a partial excision of the trapezoid was performed.  At this point, the Arthrex internal brace was utilized.  Base  of the index metacarpal was identified utilizing biplanar fluoroscopy, the K wire was  inserted into the nonarticular portion followed by drill and the first suture anchor.  Biplanar fluoroscopy was utilized to confirm appropriate positioning of the wire prior to anchor placement.  Stability was noted to this suture anchor.  Subsequently, the thumb metacarpal base was identified followed by placement of the additional suture anchor and suspension of the internal brace.  Thumb was held in gently abducted position for internal brace placement.  Biplanar fluoroscopy was utilized to confirm appropriate thumb metacarpal positioning, no significant subsidence was noted with gentle stress testing.  At this stage in the procedure, the FCR tendon was identified and the depth of the wound.  The APL tendons were identified at their distal extent overlying the base the metacarpal.  An 0 FiberWire was utilized to create a tendon transfer between the APL tendon slips and the FCR with traction on the thumb.  Excellent stability was noted.  The void created by the trapeziectomy was filled with a Gelfoam.  The capsular flaps were repaired utilizing 3-0 Vicryl suture in figure-of-eight fashion.  We then turned our attention to the thumb MP hyperextension.  Transverse incision was designed at the level of the A1 pulley of the left thumb.  Careful dissection was performed down, radial and ulnar neurovascular structures were identified carefully protected.  A1 pulley was incised sharply utilizing a Beaver blade, the flexor tendon was retracted to expose the underlying MP capsule.  Step cut was made utilizing a Beaver blade, this was tensioned and slight flexion, 3-0 Vicryl was utilized in figure-of-eight fashion to complete the capsulodesis.  We then utilized a 0.064 K wire and inserted this in retrograde fashion across the MP joint for further stability.  The tourniquet was deflated at 57 minutes.  Fingertips were pink with brisk capillary refill after deflation of tourniquet.  Copious irrigation was performed of  both wound sites.  Wrist incision was closed in layers utilizing 3-0 Vicryl for the subcutaneous tissue and a 4-0 running Monocryl for the skin surface.  Dermabond was placed.  Volar incision over the thumb A1 pulley was closed utilizing 4-0 nylon in simple sterile fashion.  Sterile dressings were provided followed by application of a thumb spica splint utilizing plaster.  The operative drapes were broken down.  The patient was awoken from anesthesia safely and taken to PACU in stable condition.  I will see her back in the office in  2 weeks  for postoperative followup.     Samuella Cota, MD Electronically signed, 09/26/22

## 2022-09-26 NOTE — Interval H&P Note (Signed)
History and Physical Interval Note:  09/26/2022 7:18 AM  Annette Jefferson  has presented today for surgery, with the diagnosis of LEFT CMC ARTHRITIS AND MP HYPEREXTENSION.  The various methods of treatment have been discussed with the patient and family. After consideration of risks, benefits and other options for treatment, the patient has consented to  Procedure(s): LEFT THUMB CMC ARTHROPLASTY WITH INTERNAL BRACE / MP CAPSULODESIS (Left) as a surgical intervention.  The patient's history has been reviewed, patient examined, no change in status, stable for surgery.  I have reviewed the patient's chart and labs.  Questions were answered to the patient's satisfaction.     Rontrell Moquin

## 2022-09-26 NOTE — Discharge Instructions (Signed)
 Hand Surgery Postop Instructions   Dressings: Maintain postoperative dressing until orthopedic follow-up.  Keep operative site clean and dry until orthopedic follow-up.  Wound Care: Keep your hand elevated above the level of your heart.  Do not allow it to dangle by your side. Moving your fingers is advised to stimulate circulation but will depend on the site of your surgery.  If you have a splint applied, your doctor will advise you regarding movement.  Activity: Do not drive or operate machinery until clearance given from physician. No heavy lifting with operative extremity.  Diet:  Drink liquids today or eat a light diet.  You may resume a regular diet tomorrow.    General expectations: Take prescribed medication if given, transition to over-the-counter medication as quickly as possible. Fingers may become slightly swollen.  Call your doctor if any of the following occur: Severe pain not relieved by pain medication. Elevated temperature. Dressing soaked with blood. Inability to move fingers. White or bluish color to fingers.

## 2022-09-27 ENCOUNTER — Encounter (HOSPITAL_BASED_OUTPATIENT_CLINIC_OR_DEPARTMENT_OTHER): Payer: Self-pay | Admitting: Orthopedic Surgery

## 2022-10-02 ENCOUNTER — Encounter (HOSPITAL_BASED_OUTPATIENT_CLINIC_OR_DEPARTMENT_OTHER): Payer: Self-pay | Admitting: Orthopedic Surgery

## 2022-10-02 NOTE — Anesthesia Postprocedure Evaluation (Signed)
Anesthesia Post Note  Patient: Annette Jefferson  Procedure(s) Performed: LEFT THUMB CMC ARTHROPLASTY WITH INTERNAL BRACE / MP CAPSULODESIS (Left: Thumb)     Patient location during evaluation: PACU Anesthesia Type: Regional and MAC Level of consciousness: awake and alert Pain management: pain level controlled Vital Signs Assessment: post-procedure vital signs reviewed and stable Respiratory status: spontaneous breathing, nonlabored ventilation and respiratory function stable Cardiovascular status: stable and blood pressure returned to baseline Postop Assessment: no apparent nausea or vomiting Anesthetic complications: no   No notable events documented.  Last Vitals:  Vitals:   09/26/22 1045 09/26/22 1103  BP: (!) 153/80 (!) 160/75  Pulse: (!) 55 (!) 57  Resp: 16 16  Temp: (!) 36.2 C (!) 36.1 C  SpO2: 93% 95%    Last Pain:  Vitals:   09/26/22 1103  TempSrc:   PainSc: 0-No pain                 Rolonda Pontarelli

## 2022-10-03 DIAGNOSIS — M25342 Other instability, left hand: Secondary | ICD-10-CM

## 2022-10-03 DIAGNOSIS — M25532 Pain in left wrist: Secondary | ICD-10-CM

## 2022-10-03 DIAGNOSIS — M19032 Primary osteoarthritis, left wrist: Secondary | ICD-10-CM

## 2022-10-11 ENCOUNTER — Encounter: Payer: Medicare HMO | Admitting: Orthopedic Surgery

## 2022-10-13 ENCOUNTER — Ambulatory Visit: Payer: Medicare HMO | Admitting: Orthopedic Surgery

## 2022-10-13 ENCOUNTER — Other Ambulatory Visit (INDEPENDENT_AMBULATORY_CARE_PROVIDER_SITE_OTHER): Payer: Self-pay

## 2022-10-13 DIAGNOSIS — M1812 Unilateral primary osteoarthritis of first carpometacarpal joint, left hand: Secondary | ICD-10-CM

## 2022-10-13 NOTE — Progress Notes (Signed)
   Annette Jefferson - 72 y.o. female MRN [id number]  Date of birth: 1950-12-22  Office Visit Note: Visit Date: 10/13/2022 PCP: Kallie Locks, FNP Referred by: Kallie Locks, FNP  Subjective:  HPI: Annette Jefferson is a 72 y.o. female who presents today for follow up 2 weeks status post left thumb CMC arthroplasty with MP capsulodesis and pinning.  She is doing well overall, pain is well-controlled with over-the-counter Tylenol at this point.  Pertinent ROS were reviewed with the patient and found to be negative unless otherwise specified above in HPI.   Assessment & Plan: Visit Diagnoses: No diagnosis found.  Plan: X-rays were performed today show stable appearance of the thumb metacarpal status post trapeziectomy, pin is well secured at the MP joint.  Sutures removed from the left thumb today at the capsulodesis.  Pin will remain in for additional 2 weeks.  Thumb spica cast reapplied today, follow-up in 2 weeks for transition to removable thumb spica brace, pin removal and begin range of motion with therapy.  Follow-up: No follow-ups on file.   Meds & Orders: No orders of the defined types were placed in this encounter.  No orders of the defined types were placed in this encounter.    Procedures: No procedures performed       Objective:   Vital Signs: There were no vitals taken for this visit.  Ortho Exam Left wrist: - Well-healing thumb CMC incision, sutures tails in place, skin edges well-approximated without erythema or drainage - Well-healing volar incision at the thumb MP, nylon sutures in place removed today without incident - Sensation intact in all distributions - Pin site clean dry and intact  Imaging: No results found.   Daltin Crist Trevor Mace, M.D. [facility] 9:34 AM

## 2022-10-20 NOTE — Therapy (Signed)
OUTPATIENT OCCUPATIONAL THERAPY ORTHO EVALUATION  Patient Name: Annette Jefferson MRN: [id number] DOB:[birth year], 72 y.o., female Today's Date: 10/23/2022  PCP: Raliegh Ip, FNP REFERRING PROVIDER: Samuella Cota, MD   END OF SESSION:  OT End of Session - 10/23/22 0856     Visit Number 1    Number of Visits 10    Date for OT Re-Evaluation 12/08/22    Authorization Type [employer]    OT Start Time 0900    OT Stop Time 1007    OT Time Calculation (min) 67 min    Equipment Utilized During Treatment orthotic materials    Activity Tolerance Patient tolerated treatment well;No increased pain;Patient limited by fatigue;Patient limited by pain    Behavior During Therapy [facility] for tasks assessed/performed             Past Medical History:  Diagnosis Date   Arthritis    GERD (gastroesophageal reflux disease)    Hyperlipemia    Hypertension    PONV (postoperative nausea and vomiting)    last time 2005   Past Surgical History:  Procedure Laterality Date   BREAST SURGERY Bilateral    cyst removed   buniectomy Left 2005   Buninectomy Right 2013   CARPOMETACARPAL (CMC) FUSION OF THUMB Left 09/26/2022   Procedure: LEFT THUMB CMC ARTHROPLASTY WITH INTERNAL BRACE / MP CAPSULODESIS;  Surgeon: Samuella Cota, MD;  Location: [facility];  Service: Orthopedics;  Laterality: Left;   COLONOSCOPY     Patient Active Problem List   Diagnosis Date Noted   Chronic instability of first MCP joint, left 10/03/2022   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 10/03/2022   Pain in left wrist 10/03/2022   Arthritis of carpometacarpal [facility]) joint of left thumb 06/02/2021   History of lumbar spinal fusion 07/02/2017   Protrusion of lumbar intervertebral disc 07/02/2017   Spinal stenosis of lumbar region 08/22/2013    ONSET DATE: DOS: 09/26/22 L CMC J arthroplasty   REFERRING DIAG: M18.12 (ICD-10-CM) - Arthritis of carpometacarpal (CMC) joint of left  thumb   THERAPY DIAG:  Localized edema  Muscle weakness (generalized)  Other lack of coordination  Pain in left wrist  Stiffness of left hand, not elsewhere classified  Pain in left hand  Rationale for Evaluation and Treatment: Rehabilitation  SUBJECTIVE:   SUBJECTIVE STATEMENT: She is now ~4 weeks post Lt thumb CMC J arthroplasty with capsulodesis and APL - FCR transfer. She has just left her MD appointment where K-wire was removed. 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     PERTINENT HISTORY: Pre last MD visit 10/13/22: "X-rays were performed today show stable appearance of the thumb metacarpal status post trapeziectomy, pin is well secured at the MP joint. Sutures removed from the left thumb today at the capsulodesis. Pin will remain in for additional 2 weeks. Thumb spica cast reapplied today, follow-up in 2 weeks for transition to removable thumb spica brace, pin removal and begin range of motion with therapy. "   PRECAUTIONS: Other: tree / coconut allergy   RED FLAGS: None   WEIGHT BEARING RESTRICTIONS: Yes NWB Lt hand now   PAIN:  Are you having pain? Yes: NPRS scale: 1-2/10 at rest now and no significant pain in past week  Pain location: Lt thumb base and wrist Pain description: sore, aching  Aggravating factors: movement  Relieving factors: rest  FALLS: Has  patient fallen in last 6 months? No  LIVING ENVIRONMENT: Lives with: lives with their spouse Has following equipment at home: None  PLOF: Independent  PATIENT GOALS: To improve the use of her left thumb and hand, return to daily activities safely    OBJECTIVE: (All objective assessments below are from initial evaluation on: 10/23/22 unless otherwise specified.)   HAND DOMINANCE: Right   ADLs: Overall ADLs: States decreased ability to grab, hold household objects, pain and difficulty to open containers,  perform FMS tasks (manipulate fasteners on clothing), mild to moderate bathing problems as well.    FUNCTIONAL OUTCOME MEASURES: Eval: Quick DASH 47% impairment today  (Higher % Score  =  More Impairment)     UPPER EXTREMITY ROM     Shoulder to Wrist AROM LEFT eval  Forearm supination 75  Forearm pronation  65  Wrist flexion 28  Wrist extension 44  Wrist ulnar deviation 16  Wrist radial deviation (-5)  Functional dart thrower's motion (F-DTM) in ulnar flexion 5  F-DTM in radial extension  24  (Blank rows = not tested)   Hand AROM LEFT eval  Full Fist Ability (or Gap to Distal Palmar Crease) Loose and lacking 1cm to [facility] in composite   Thumb Opposition  (Kapandji Scale)  3/10  Thumb MCP (0-60) 0-15  Thumb IP (0-80) 0-10  Thumb Radial Abduction Span   Thumb Palmar Abduction Span   (Blank rows = not tested)   UPPER EXTREMITY MMT:    Eval:  NT at eval due to recent and still healing injuries. Will be tested when appropriate.   MMT Right TBD  Shoulder flexion   Shoulder abduction   Shoulder adduction   Shoulder extension   Shoulder internal rotation   Shoulder external rotation   Middle trapezius   Lower trapezius   Elbow flexion   Elbow extension   Forearm supination   Forearm pronation   Wrist flexion   Wrist extension   Wrist ulnar deviation   Wrist radial deviation   (Blank rows = not tested)  HAND FUNCTION: Eval: Observed weakness in affected Lt hand.  Grip strength Right: TBD lbs, Left: TBD lbs   COORDINATION: Eval: Observed coordination impairments with affected Lt hand. 9 Hole Peg Test Left: TBD sec (TBD sec is WFL)   SENSATION: Eval:  Light touch intact today, though mildly hypersensitive around sx area    EDEMA:   Eval:  Mildly swollen in Lt hand and wrist today,  COGNITION: Eval: Overall cognitive status: WFL for evaluation today, though she has some guarding from pain behaviors and some overt fear and habits of stiffness in the left hand  and arm  OBSERVATIONS:   Eval:  She underwent CMC J arthroplasty, partial trapezoid-ectomy, APL - FCR tendon transfer, and MP capsulodesis with K-wire pinning.  Surgical area is glued shut volar thumb area is well-healing.   TODAY'S TREATMENT:  Post-evaluation treatment:   For her safety and self-care she was recommended to not soak her arm or her thumb but she can wash and pat dry.  She should also not lift or push/pull for at least 4 more weeks.  She was told to avoid forces that extend the thumb at the MCP joint.  Also she was told how to do light scar mobilization and desensitization touch 4-6 times a day for 2 to 3 minutes at a time.  She states understanding that info  Custom orthotic fabrication was indicated due to pt's healing left thumb surgery and need  for safe, functional positioning. OT fabricated custom forearm-based thumb spica orthosis for pt today to help wrist and thumb safely heal. It fit well with no areas of pressure, pt states a comfortable fit. Pt was educated on the wearing schedule (on at all times except for hygiene and exercises), to avoid exposing it to sources of heat, to wipe clean as needed (do not wash, use harsh detergents), to call or come in ASAP if it is causing any irritation or is not achieving desired function. It will be checked/adjusted in upcoming sessions, as needed. Pt states understanding all directions.    She was also given the following home exercise program to begin 4-6 times a day to work the entire left arm as well as specifically forearm, wrist, hand and thumb.  She tried these with OT explanation and demonstration today and had no significant pain when doing these.  Exercises - Reach arms upward   - 4 x daily - 10 reps - Turn [employer] Facing Up & Down  - 4-6 x daily - 15 reps - Bend and Pull Back Wrist SLOWLY  - 4-6 x daily - 15 reps - "Windshield Wipers"   - 4-6 x daily - 15 reps - Wrist AROM Dart Throwers Motion  - 4-6 x daily - 15 reps - Tendon  Glides  - 4-6 x daily - 3-5 reps - 2-3 seconds hold - Finger Spreading  - 4-6 x daily - 10 reps - Thumb Opposition  - 4-6 x daily - 10 reps - Seated Thumb Circumduction AROM  - 4-6 x daily - 15 reps   -Scar mobilization/desensitization    PATIENT EDUCATION: Education details: See tx section above for details  Person educated: Patient Education method: Verbal Instruction, Teach back, Handouts  Education comprehension: States and demonstrates understanding, Additional Education required    HOME EXERCISE PROGRAM: Access Code: [email or web address] URL: [email or web address] Date: 10/23/2022 Prepared by: Fannie Knee   GOALS: Goals reviewed with patient? Yes   SHORT TERM GOALS: (STG required if POC>30 days) Target Date: 11/10/22  Pt will obtain protective, custom orthotic. Goal status:  10/23/22 MET   2.  Pt will demo/state understanding of initial HEP to improve pain levels and prerequisite motion. Goal status: INITIAL   LONG TERM GOALS: Target Date: 12/08/22  Pt will improve functional ability by decreased impairment per Quick DASH assessment from 47% to 15% or better, for better quality of life. Goal status: INITIAL  2.  Pt will improve grip strength in Lt hand from unsafe to test to at least 25lbs for functional use at home and in IADLs. Goal status: INITIAL  3.  Pt will improve A/ROM in Lt thumb MP/IP flex from 15* / 10* to at least 40* for each, to have functional motion for tasks like reach and grasp.  Goal status: INITIAL  4.  Pt will improve strength in Lt wrist flex/ext from unsafe and painful MMT to at least 4+/5 MMT to have increased functional ability to carry out selfcare and higher-level homecare tasks with less difficulty. Goal status: INITIAL  5.  Pt will improve coordination skills in Lt hand, as seen by [facility] score on 9HPT testing to have increased functional ability to carry out fine motor tasks (fasteners, etc.) and more complex, coordinated IADLs  (meal prep, sports, etc.).  Goal status: INITIAL  6.  Pt will keep resting pain at <3 /10 to have good sleep and occupational participation in daily roles. Goal status: INITIAL   ASSESSMENT:  CLINICAL IMPRESSION:  Patient is a 72 y.o. female who was seen today for occupational therapy evaluation for residual deficits (pain, stiffness, weakness) following left thumb CMC arthroplasty and other accompanying procedures.  She will benefit from outpatient occupational therapy to increase functional ability and quality of life  PERFORMANCE DEFICITS: in functional skills including ADLs, IADLs, coordination, dexterity, proprioception, sensation, edema, ROM, strength, pain, fascial restrictions, muscle spasms, flexibility, Fine motor control, Gross motor control, body mechanics, decreased knowledge of precautions, wound, and UE functional use, cognitive skills including problem solving and safety awareness, and psychosocial skills including coping strategies, environmental adaptation, and habits.   IMPAIRMENTS: are limiting patient from ADLs, IADLs, rest and sleep, work, and leisure.   COMORBIDITIES: may have co-morbidities  that affects occupational performance. Patient will benefit from skilled OT to address above impairments and improve overall function.  MODIFICATION OR ASSISTANCE TO COMPLETE EVALUATION: No modification of tasks or assist necessary to complete an evaluation.  OT OCCUPATIONAL PROFILE AND HISTORY: Problem focused assessment: Including review of records relating to presenting problem.  CLINICAL DECISION MAKING: LOW - limited treatment options, no task modification necessary  REHAB POTENTIAL: Excellent  EVALUATION COMPLEXITY: Low      PLAN:  OT FREQUENCY: 1-2x/week  OT DURATION: 6 weeks, through 12/08/22 for a total of 10 visits requested   PLANNED INTERVENTIONS: self care/ADL training, therapeutic exercise, therapeutic activity, neuromuscular re-education, manual therapy,  scar mobilization, passive range of motion, splinting, ultrasound, fluidotherapy, compression bandaging, moist heat, cryotherapy, contrast bath, patient/family education, energy conservation, coping strategies training, DME and/or AE instructions, Re-evaluation, and Dry needling  RECOMMENDED OTHER SERVICES: none now   CONSULTED AND AGREED WITH PLAN OF CARE: Patient  PLAN FOR NEXT SESSION:  Check orthosis and check initial recommendations and home exercise program, upgrade to a hand-based brace that 6-week mark, strengthening can begin around 8 weeks   Fannie Knee, OTR/L, CHT 10/23/2022, 5:44 PM     Referring diagnosis? M18.12 (ICD-10-CM) - Arthritis of carpometacarpal (CMC) joint of left thumb  Treatment diagnosis? (if different than referring diagnosis)  ICD-10-CM: R60.0     2.   Muscle weakness (generalized)       ICD-10-CM: M62.81     3.   Other lack of coordination       ICD-10-CM: R27.8     4.   Pain in left wrist       ICD-10-CM: M25.532     5.   Stiffness of left hand, not elsewhere classified       ICD-10-CM: M25.642     6.   Pain in left hand       ICD-10-CM: M79.642     What was this (referring dx) caused by? [x]  Surgery []  Fall []  Ongoing issue [x]  Arthritis []  Other: ____________  Laterality: []  Rt [x]  Lt []  Both  Check all possible CPT codes:  *CHOOSE 10 OR LESS*    [x]  97110 (Therapeutic Exercise)  []  92507 (SLP Treatment)  []  97112 (Neuro Re-ed)   []  92526 (Swallowing Treatment)   []  97116 (Gait Training)   []  [id number] (Cognitive Training, 1st 15 minutes) [x]  97140 (Manual Therapy)   []  97130 (Cognitive Training, each add'l 15 minutes)  []  97164 (Re-evaluation)                              []  Other, List CPT Code ____________  [x]  97530 (Therapeutic Activities)     [x]  97535 (Self Care)   []  All codes  above (97110 - 97535)  []  97012 (Mechanical Traction)  []  97014 (E-stim Unattended)  []  16109 (E-stim manual)  []  97033 (Ionto)  [x]  97035  (Ultrasound) []  97750 (Physical Performance Training) []  [id number] (Aquatic Therapy) []  97016 (Vasopneumatic Device) []  [id number] (Paraffin) []  97034 (Contrast Bath) []  97597 (Wound Care 1st 20 sq cm) []  97598 (Wound Care each add'l 20 sq cm) [x]  97760 (Orthotic Fabrication, Fitting, Training Initial) []  [id number] (Prosthetic Management and Training Initial) [x]  [id number] (Orthotic or Prosthetic Training/ Modification Subsequent)

## 2022-10-23 ENCOUNTER — Ambulatory Visit (INDEPENDENT_AMBULATORY_CARE_PROVIDER_SITE_OTHER): Payer: Medicare HMO | Admitting: Orthopedic Surgery

## 2022-10-23 ENCOUNTER — Ambulatory Visit: Payer: Medicare HMO | Admitting: Rehabilitative and Restorative Service Providers"

## 2022-10-23 ENCOUNTER — Encounter: Payer: Self-pay | Admitting: Rehabilitative and Restorative Service Providers"

## 2022-10-23 ENCOUNTER — Other Ambulatory Visit: Payer: Self-pay

## 2022-10-23 DIAGNOSIS — R278 Other lack of coordination: Secondary | ICD-10-CM

## 2022-10-23 DIAGNOSIS — M1812 Unilateral primary osteoarthritis of first carpometacarpal joint, left hand: Secondary | ICD-10-CM

## 2022-10-23 DIAGNOSIS — M25642 Stiffness of left hand, not elsewhere classified: Secondary | ICD-10-CM | POA: Diagnosis not present

## 2022-10-23 DIAGNOSIS — R600 Localized edema: Secondary | ICD-10-CM | POA: Diagnosis not present

## 2022-10-23 DIAGNOSIS — M6281 Muscle weakness (generalized): Secondary | ICD-10-CM

## 2022-10-23 DIAGNOSIS — M25532 Pain in left wrist: Secondary | ICD-10-CM

## 2022-10-23 DIAGNOSIS — M79642 Pain in left hand: Secondary | ICD-10-CM

## 2022-10-23 NOTE — Progress Notes (Signed)
Annette Jefferson - 72 y.o. female MRN [id number]  Date of birth: April 17, 1950  Office Visit Note: Visit Date: 10/23/2022 PCP: Kallie Locks, FNP Referred by: Kallie Locks, FNP  Subjective:  HPI: Annette Jefferson is a 72 y.o. female who presents today for follow up 4 weeks status post left thumb CMC arthroplasty with MP capsulodesis.  She is doing well overall, has been compliant with immobilization as instructed.  Pain is well-controlled.  Pertinent ROS were reviewed with the patient and found to be negative unless otherwise specified above in HPI.   Assessment & Plan: Visit Diagnoses: No diagnosis found.  Plan: Pin removed today without complication.  She will be transition to a removable thumb spica orthosis fabricated by occupational therapy today.  She can begin range of motion of the left thumb per protocol.  Will wait until 8 weeks postoperatively to begin strengthening.  Okay to convert to hand-based splint at 6 weeks with OT.  Follow-up: No follow-ups on file.   Meds & Orders: No orders of the defined types were placed in this encounter.  No orders of the defined types were placed in this encounter.    Procedures: No procedures performed       Objective:   Vital Signs: There were no vitals taken for this visit.  Ortho Exam Left hand: - Pin site clean dry and intact, pin removed without complication, no erythema or drainage - Gentle range of motion of the thumb without pain, gentle thumb circumduction without pain, no significant crepitus - Sensation intact distally, hand is warm well-perfused  Imaging: No results found.   Teddrick Mallari Trevor Mace, M.D. [facility] 8:41 AM

## 2022-10-24 ENCOUNTER — Encounter: Payer: Medicare HMO | Admitting: Rehabilitative and Restorative Service Providers"

## 2022-11-01 ENCOUNTER — Encounter: Payer: Medicare HMO | Admitting: Rehabilitative and Restorative Service Providers"

## 2022-11-03 ENCOUNTER — Encounter: Payer: Medicare HMO | Admitting: Rehabilitative and Restorative Service Providers"

## 2022-11-09 ENCOUNTER — Encounter: Payer: Medicare HMO | Admitting: Rehabilitative and Restorative Service Providers"

## 2022-11-16 ENCOUNTER — Encounter: Payer: Self-pay | Admitting: Rehabilitative and Restorative Service Providers"

## 2022-11-16 ENCOUNTER — Ambulatory Visit: Payer: Medicare HMO | Admitting: Rehabilitative and Restorative Service Providers"

## 2022-11-16 DIAGNOSIS — M25642 Stiffness of left hand, not elsewhere classified: Secondary | ICD-10-CM

## 2022-11-16 DIAGNOSIS — M6281 Muscle weakness (generalized): Secondary | ICD-10-CM

## 2022-11-16 DIAGNOSIS — M25532 Pain in left wrist: Secondary | ICD-10-CM

## 2022-11-16 DIAGNOSIS — R252 Cramp and spasm: Secondary | ICD-10-CM

## 2022-11-16 DIAGNOSIS — R278 Other lack of coordination: Secondary | ICD-10-CM

## 2022-11-16 DIAGNOSIS — R600 Localized edema: Secondary | ICD-10-CM

## 2022-11-16 DIAGNOSIS — M5459 Other low back pain: Secondary | ICD-10-CM

## 2022-11-16 DIAGNOSIS — M79642 Pain in left hand: Secondary | ICD-10-CM

## 2022-11-16 NOTE — Therapy (Signed)
OUTPATIENT OCCUPATIONAL THERAPY TREATMENT NOTE  Patient Name: Annette Jefferson MRN: [id number] DOB:1950-10-04, 72 y.o., female Today's Date: 11/16/2022  PCP: Raliegh Ip, FNP REFERRING PROVIDER: Samuella Cota, MD   END OF SESSION:  OT End of Session - 11/16/22 1057     Visit Number 2    Number of Visits 10    Date for OT Re-Evaluation 12/08/22    Authorization Type [employer]    OT Start Time 1059    OT Stop Time 1140    OT Time Calculation (min) 41 min    Equipment Utilized During Treatment orthotic materials    Activity Tolerance Patient tolerated treatment well;No increased pain;Patient limited by fatigue;Patient limited by pain    Behavior During Therapy [facility] for tasks assessed/performed              Past Medical History:  Diagnosis Date   Arthritis    GERD (gastroesophageal reflux disease)    Hyperlipemia    Hypertension    PONV (postoperative nausea and vomiting)    last time 2005   Past Surgical History:  Procedure Laterality Date   BREAST SURGERY Bilateral    cyst removed   buniectomy Left 2005   Buninectomy Right 2013   CARPOMETACARPAL (CMC) FUSION OF THUMB Left 09/26/2022   Procedure: LEFT THUMB CMC ARTHROPLASTY WITH INTERNAL BRACE / MP CAPSULODESIS;  Surgeon: Samuella Cota, MD;  Location: [facility];  Service: Orthopedics;  Laterality: Left;   COLONOSCOPY     Patient Active Problem List   Diagnosis Date Noted   Chronic instability of first MCP joint, left 10/03/2022   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 10/03/2022   Pain in left wrist 10/03/2022   Arthritis of carpometacarpal [facility]) joint of left thumb 06/02/2021   History of lumbar spinal fusion 07/02/2017   Protrusion of lumbar intervertebral disc 07/02/2017   Spinal stenosis of lumbar region 08/22/2013    ONSET DATE: DOS: 09/26/22 L CMC J arthroplasty   REFERRING DIAG: M18.12 (ICD-10-CM) - Arthritis of carpometacarpal (CMC) joint of left  thumb   THERAPY DIAG:  Localized edema  Muscle weakness (generalized)  Pain in left wrist  Stiffness of left hand, not elsewhere classified  Pain in left hand  Other lack of coordination  Cramp and spasm  Other low back pain  Rationale for Evaluation and Treatment: Rehabilitation  PERTINENT HISTORY: Pre last MD visit 10/13/22: "X-rays were performed today show stable appearance of the thumb metacarpal status post trapeziectomy, pin is well secured at the MP joint. Sutures removed from the left thumb today at the capsulodesis. Pin will remain in for additional 2 weeks. Thumb spica cast reapplied today, follow-up in 2 weeks for transition to removable thumb spica brace, pin removal and begin range of motion with therapy. "  She has just left her MD appointment where K-wire was removed. 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   PRECAUTIONS: Other: tree / coconut allergy   RED FLAGS: None   WEIGHT BEARING RESTRICTIONS: Yes NWB Lt hand now     SUBJECTIVE:   SUBJECTIVE STATEMENT:  She is now 7+  weeks post Lt thumb CMC J arthroplasty with capsulodesis and APL - FCR transfer.  She states doing well and was unable to come in unfortunately due to Covid, but has recovered. Has been doing HEP.      PAIN:  Are you having  pain? Yes: NPRS scale:  1-2/10 at rest now and no significant pain in past week  Pain location: Lt thumb base and wrist Pain description: sore, aching  Aggravating factors: movement  Relieving factors: rest   PATIENT GOALS: To improve the use of her left thumb and hand, return to daily activities safely    OBJECTIVE: (All objective assessments below are from initial evaluation on: 10/23/22 unless otherwise specified.)   HAND DOMINANCE: Right   ADLs: Overall ADLs: States decreased ability to grab, hold household objects, pain and difficulty to open containers,  perform FMS tasks (manipulate fasteners on clothing), mild to moderate bathing problems as well.    FUNCTIONAL OUTCOME MEASURES: Eval: Quick DASH 47% impairment today  (Higher % Score  =  More Impairment)     UPPER EXTREMITY ROM     Shoulder to Wrist AROM LEFT eval Lt 11/16/22  Forearm supination 75 90  Forearm pronation  65 82  Wrist flexion 28 37  Wrist extension 44 58  Wrist ulnar deviation 16 30  Wrist radial deviation (-5) 9  Functional dart thrower's motion (F-DTM) in ulnar flexion 5 40  F-DTM in radial extension  24 38  (Blank rows = not tested)   Hand AROM LEFT eval Lt 11/16/22  Full Fist Ability (or Gap to Distal Palmar Crease) Loose and lacking 1cm to [facility] in composite    Thumb Opposition  (Kapandji Scale)  3/10 5/10   Thumb MCP (0-60) 0-15 0 - 26  Thumb IP (0-80) 0-10 0 - 38  Thumb Radial Abduction Span    Thumb Palmar Abduction Span    (Blank rows = not tested)   UPPER EXTREMITY MMT:    Eval:  NT at eval due to recent and still healing injuries. Will be tested when appropriate.   MMT Right TBD  Shoulder flexion   Shoulder abduction   Shoulder adduction   Shoulder extension   Shoulder internal rotation   Shoulder external rotation   Middle trapezius   Lower trapezius   Elbow flexion   Elbow extension   Forearm supination   Forearm pronation   Wrist flexion   Wrist extension   Wrist ulnar deviation   Wrist radial deviation   (Blank rows = not tested)  HAND FUNCTION: 11/16/22: Grip strength Right: 44.5 lbs, Left: 13 lbs  no significant pain  Eval: Observed weakness in affected Lt hand.    COORDINATION: Eval: Observed coordination impairments with affected Lt hand. 9 Hole Peg Test Left: TBD sec (TBD sec is WFL)   SENSATION: Eval:  Light touch intact today, though mildly hypersensitive around sx area    EDEMA:   Eval:  Mildly swollen in Lt hand and wrist today,  COGNITION: Eval: Overall cognitive status: WFL for evaluation today,  though she has some guarding from pain behaviors and some overt fear and habits of stiffness in the left hand and arm  OBSERVATIONS:   Eval:  She underwent CMC J arthroplasty, partial trapezoid-ectomy, APL - FCR tendon transfer, and MP capsulodesis with K-wire pinning.  Surgical area is glued shut volar thumb area is well-healing.   TODAY'S TREATMENT:  11/16/22: She starts with a check of motion by performing active range of motion exercises at the wrist, hand, thumb.  These show significant improvement since last seen which is excellent.  As she is more than 7 weeks out from her surgery, she was upgraded to a hand-based orthosis versus a forearm-based orthosis and also educated on light isometric grip training  as well as stretches at the wrist.  Some of these could have been done earlier, but she had COVID and was unable to come in.  OT custom fabricates a hand-based thumb spica orthosis with IP joint free to protect the [facility] surgery, but allow the wrist to move freely.  She states it fits well with no pain or problems when moving the wrist or wearing the orthosis.  She was educated to wear the longer brace in the night still.  During the day she should remove her orthosis to perform upgraded home exercise program as listed below (he newer exercises are bolded).  Each of these new exercises was demonstrated to her and perform with her before she performs them back.  She states tolerating them well with no significant pain or problems at the end of the session.   Exercises - Wrist Flexion Stretch  - 4 x daily - 3-5 reps - 15 sec hold - Wrist Prayer Stretch  - 4 x daily - 3-5 reps - 15 sec hold - Bend and Pull Back Wrist SLOWLY  - 4-6 x daily - 15 reps - "Windshield Wipers"   - 4-6 x daily - 15 reps - Seated Composite Thumb Flexion PROM  - 2-3 x daily - 3-5 reps - 15 sec hold - Thumb Opposition  - 4-6 x daily - 10 reps - Seated Thumb Circumduction AROM  - 4-6 x daily - 15 reps - Towel Roll Grip  with Forearm in Neutral  - 3 x daily - 5 reps - 10 sec hold   PATIENT EDUCATION: Education details: See tx section above for details  Person educated: Patient Education method: Verbal Instruction, Teach back, Handouts  Education comprehension: States and demonstrates understanding, Additional Education required    HOME EXERCISE PROGRAM: Access Code: [email or web address] URL: [email or web address] Date: 10/23/2022 Prepared by: Fannie Knee   GOALS: Goals reviewed with patient? Yes   SHORT TERM GOALS: (STG required if POC>30 days) Target Date: 11/10/22  Pt will obtain protective, custom orthotic. Goal status:  10/23/22 MET   2.  Pt will demo/state understanding of initial HEP to improve pain levels and prerequisite motion. Goal status: INITIAL   LONG TERM GOALS: Target Date: 12/08/22  Pt will improve functional ability by decreased impairment per Quick DASH assessment from 47% to 15% or better, for better quality of life. Goal status: INITIAL  2.  Pt will improve grip strength in Lt hand from unsafe to test to at least 25lbs for functional use at home and in IADLs. Goal status: INITIAL  3.  Pt will improve A/ROM in Lt thumb MP/IP flex from 15* / 10* to at least 40* for each, to have functional motion for tasks like reach and grasp.  Goal status: INITIAL  4.  Pt will improve strength in Lt wrist flex/ext from unsafe and painful MMT to at least 4+/5 MMT to have increased functional ability to carry out selfcare and higher-level homecare tasks with less difficulty. Goal status: INITIAL  5.  Pt will improve coordination skills in Lt hand, as seen by [facility] score on 9HPT testing to have increased functional ability to carry out fine motor tasks (fasteners, etc.) and more complex, coordinated IADLs (meal prep, sports, etc.).  Goal status: INITIAL  6.  Pt will keep resting pain at <3 /10 to have good sleep and occupational participation in daily roles. Goal status:  INITIAL   ASSESSMENT:  CLINICAL IMPRESSION: 11/16/22: She had to miss at least 2 weeks of therapy  due to having COVID virus, but fortunately she is on track and doing well not stiff or painful etc.  Carry on  Eval: Patient is a 72 y.o. female who was seen today for occupational therapy evaluation for residual deficits (pain, stiffness, weakness) following left thumb CMC arthroplasty and other accompanying procedures.  She will benefit from outpatient occupational therapy to increase functional ability and quality of life    PLAN:  OT FREQUENCY: 1-2x/week  OT DURATION: 6 weeks, through 12/08/22 for a total of 10 visits requested   PLANNED INTERVENTIONS: self care/ADL training, therapeutic exercise, therapeutic activity, neuromuscular re-education, manual therapy, scar mobilization, passive range of motion, splinting, ultrasound, fluidotherapy, compression bandaging, moist heat, cryotherapy, contrast bath, patient/family education, energy conservation, coping strategies training, DME and/or AE instructions, Re-evaluation, and Dry needling  RECOMMENDED OTHER SERVICES: none now   CONSULTED AND AGREED WITH PLAN OF CARE: Patient  PLAN FOR NEXT SESSION:  Check new hand-based orthosis as well as discussed starting to wean from her orthoses.  Check new stretches and strengthening and continue to upgrade strengthening at 8 weeks postop.   Fannie Knee, OTR/L, CHT 11/16/2022, 1:11 PM

## 2022-11-22 NOTE — Therapy (Signed)
OUTPATIENT OCCUPATIONAL THERAPY TREATMENT NOTE  Patient Name: Annette Jefferson MRN: [id number] DOB:1950/05/31, 72 y.o., female Today's Date: 11/23/2022  PCP: Raliegh Ip, FNP REFERRING PROVIDER: Samuella Cota, MD   END OF SESSION:  OT End of Session - 11/23/22 1100     Visit Number 3    Number of Visits 10    Date for OT Re-Evaluation 12/08/22    Authorization Type [employer]    OT Start Time 1100    OT Stop Time 1139    OT Time Calculation (min) 39 min    Equipment Utilized During Treatment yellow putty    Activity Tolerance Patient tolerated treatment well;No increased pain;Patient limited by fatigue;Patient limited by pain    Behavior During Therapy [facility] for tasks assessed/performed               Past Medical History:  Diagnosis Date   Arthritis    GERD (gastroesophageal reflux disease)    Hyperlipemia    Hypertension    PONV (postoperative nausea and vomiting)    last time 2005   Past Surgical History:  Procedure Laterality Date   BREAST SURGERY Bilateral    cyst removed   buniectomy Left 2005   Buninectomy Right 2013   CARPOMETACARPAL (CMC) FUSION OF THUMB Left 09/26/2022   Procedure: LEFT THUMB CMC ARTHROPLASTY WITH INTERNAL BRACE / MP CAPSULODESIS;  Surgeon: Samuella Cota, MD;  Location: [facility];  Service: Orthopedics;  Laterality: Left;   COLONOSCOPY     Patient Active Problem List   Diagnosis Date Noted   Chronic instability of first MCP joint, left 10/03/2022   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 10/03/2022   Pain in left wrist 10/03/2022   Arthritis of carpometacarpal [facility]) joint of left thumb 06/02/2021   History of lumbar spinal fusion 07/02/2017   Protrusion of lumbar intervertebral disc 07/02/2017   Spinal stenosis of lumbar region 08/22/2013    ONSET DATE: DOS: 09/26/22 L CMC J arthroplasty   REFERRING DIAG: M18.12 (ICD-10-CM) - Arthritis of carpometacarpal (CMC) joint of left thumb    THERAPY DIAG:  Localized edema  Muscle weakness (generalized)  Stiffness of left hand, not elsewhere classified  Pain in left hand  Other lack of coordination  Pain in left wrist  Rationale for Evaluation and Treatment: Rehabilitation  PERTINENT HISTORY: Pre last MD visit 10/13/22: "X-rays were performed today show stable appearance of the thumb metacarpal status post trapeziectomy, pin is well secured at the MP joint. Sutures removed from the left thumb today at the capsulodesis. Pin will remain in for additional 2 weeks. Thumb spica cast reapplied today, follow-up in 2 weeks for transition to removable thumb spica brace, pin removal and begin range of motion with therapy. "  She has just left her MD appointment where K-wire was removed. 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   PRECAUTIONS: Other: tree / coconut allergy   RED FLAGS: None   WEIGHT BEARING RESTRICTIONS: Yes NWB Lt hand now     SUBJECTIVE:   SUBJECTIVE STATEMENT:  She is now 8  weeks post Lt thumb CMC J arthroplasty with capsulodesis and APL - FCR transfer.  She states new HEP is going well      PAIN:  Are you having pain? Yes: NPRS scale:  1-2/10 at rest now and no significant pain in past week  Pain location: Lt thumb base  and wrist Pain description: sore, aching  Aggravating factors: movement  Relieving factors: rest   PATIENT GOALS: To improve the use of her left thumb and hand, return to daily activities safely    OBJECTIVE: (All objective assessments below are from initial evaluation on: 10/23/22 unless otherwise specified.)   HAND DOMINANCE: Right   ADLs: Overall ADLs: States decreased ability to grab, hold household objects, pain and difficulty to open containers, perform FMS tasks (manipulate fasteners on clothing), mild to moderate bathing problems as well.    FUNCTIONAL OUTCOME  MEASURES: Eval: Quick DASH 47% impairment today  (Higher % Score  =  More Impairment)     UPPER EXTREMITY ROM     Shoulder to Wrist AROM LEFT eval Lt 11/16/22  Forearm supination 75 90  Forearm pronation  65 82  Wrist flexion 28 37  Wrist extension 44 58  Wrist ulnar deviation 16 30  Wrist radial deviation (-5) 9  Functional dart thrower's motion (F-DTM) in ulnar flexion 5 40  F-DTM in radial extension  24 38  (Blank rows = not tested)   Hand AROM LEFT eval Lt 11/16/22 Lt 11/23/22  Full Fist Ability (or Gap to Distal Palmar Crease) Loose and lacking 1cm to [facility] in composite     Thumb Opposition  (Kapandji Scale)  3/10 5/10    Thumb MCP (0-60) 0-15 0 - 26 0 - 22  Thumb IP (0-80) 0-10 0 - 38 0 - 36  Thumb Radial Abduction Span     Thumb Palmar Abduction Span     (Blank rows = not tested)   UPPER EXTREMITY MMT:    Eval:  NT at eval due to recent and still healing injuries. Will be tested when appropriate.   MMT Right TBD  Shoulder flexion   Shoulder abduction   Shoulder adduction   Shoulder extension   Shoulder internal rotation   Shoulder external rotation   Middle trapezius   Lower trapezius   Elbow flexion   Elbow extension   Forearm supination   Forearm pronation   Wrist flexion   Wrist extension   Wrist ulnar deviation   Wrist radial deviation   (Blank rows = not tested)  HAND FUNCTION: 11/16/22: Grip strength Right: 44.5 lbs, Left: 13 lbs  no significant pain  Eval: Observed weakness in affected Lt hand.    COORDINATION: Eval: Observed coordination impairments with affected Lt hand. 9 Hole Peg Test Left: TBD sec (TBD sec is WFL)   SENSATION: Eval:  Light touch intact today, though mildly hypersensitive around sx area    EDEMA:   Eval:  Mildly swollen in Lt hand and wrist today,  COGNITION: Eval: Overall cognitive status: WFL for evaluation today, though she has some guarding from pain behaviors and some overt fear and habits of stiffness in  the left hand and arm  OBSERVATIONS:   Eval:  She underwent CMC J arthroplasty, partial trapezoid-ectomy, APL - FCR tendon transfer, and MP capsulodesis with K-wire pinning.  Surgical area is glued shut volar thumb area is well-healing.   TODAY'S TREATMENT:  11/23/22: She starts with AROM for exercise as well as new measures which shows stiffness this cold morning, so she uses fluidotherapy for heat with concurrent AROM in tendon glides for activity to loosen up. OT does manual therapy IASTM and manual stretches for review of HEP as well. She tolerates these well with AROM visually improving.   Next she is upgraded to t-putty strength at 8 weeks post-op and tolerates  light yellow putty well, with now pain. She does the exercises listed below after demo and explanation, and does well with them. She was advised to do cautiously 2 x day.  She was also told to progressively keep weaning from orthosis up to 4-5 hours in the day, as long as well tolerated, but no weight lifting yet.   Exercises - Wrist Flexion Stretch  - 4 x daily - 3-5 reps - 15 sec hold - Wrist Prayer Stretch  - 4 x daily - 3-5 reps - 15 sec hold - Bend and Pull Back Wrist SLOWLY  - 4-6 x daily - 15 reps - "Windshield Wipers"   - 4-6 x daily - 15 reps - Seated Composite Thumb Flexion PROM  - 2-3 x daily - 3-5 reps - 15 sec hold - Thumb Opposition  - 4-6 x daily - 10 reps - Full Fist  - 2-3 x daily - 5 reps - "Duck Mouth" Strength  - 2-3 x daily - 5 reps - Thumb Opposition with Putty  - 2-3 x daily - 5 reps   PATIENT EDUCATION: Education details: See tx section above for details  Person educated: Patient Education method: Verbal Instruction, Teach back, Handouts  Education comprehension: States and demonstrates understanding, Additional Education required    HOME EXERCISE PROGRAM: Access Code: [email or web address] URL: [email or web address] Date: 10/23/2022 Prepared by: Fannie Knee   GOALS: Goals reviewed with  patient? Yes   SHORT TERM GOALS: (STG required if POC>30 days) Target Date: 11/10/22  Pt will obtain protective, custom orthotic. Goal status:  10/23/22 MET   2.  Pt will demo/state understanding of initial HEP to improve pain levels and prerequisite motion. Goal status: INITIAL   LONG TERM GOALS: Target Date: 12/08/22  Pt will improve functional ability by decreased impairment per Quick DASH assessment from 47% to 15% or better, for better quality of life. Goal status: INITIAL  2.  Pt will improve grip strength in Lt hand from unsafe to test to at least 25lbs for functional use at home and in IADLs. Goal status: INITIAL  3.  Pt will improve A/ROM in Lt thumb MP/IP flex from 15* / 10* to at least 40* for each, to have functional motion for tasks like reach and grasp.  Goal status: INITIAL  4.  Pt will improve strength in Lt wrist flex/ext from unsafe and painful MMT to at least 4+/5 MMT to have increased functional ability to carry out selfcare and higher-level homecare tasks with less difficulty. Goal status: INITIAL  5.  Pt will improve coordination skills in Lt hand, as seen by [facility] score on 9HPT testing to have increased functional ability to carry out fine motor tasks (fasteners, etc.) and more complex, coordinated IADLs (meal prep, sports, etc.).  Goal status: INITIAL  6.  Pt will keep resting pain at <3 /10 to have good sleep and occupational participation in daily roles. Goal status: INITIAL   ASSESSMENT:  CLINICAL IMPRESSION: 11/23/22: Doing well, tolerating stretches and new t-putty strength with no pain, cautiously.  Carry on   11/16/22: She had to miss at least 2 weeks of therapy due to having COVID virus, but fortunately she is on track and doing well not stiff or painful etc.  Carry on  Eval: Patient is a 72 y.o. female who was seen today for occupational therapy evaluation for residual deficits (pain, stiffness, weakness) following left thumb CMC arthroplasty and  other accompanying procedures.  She will benefit from outpatient occupational therapy to increase  functional ability and quality of life    PLAN:  OT FREQUENCY: 1-2x/week  OT DURATION: 6 weeks, through 12/08/22 for a total of 10 visits requested   PLANNED INTERVENTIONS: self care/ADL training, therapeutic exercise, therapeutic activity, neuromuscular re-education, manual therapy, scar mobilization, passive range of motion, splinting, ultrasound, fluidotherapy, compression bandaging, moist heat, cryotherapy, contrast bath, patient/family education, energy conservation, coping strategies training, DME and/or AE instructions, Re-evaluation, and Dry needling  CONSULTED AND AGREED WITH PLAN OF CARE: Patient  PLAN FOR NEXT SESSION:  Check ne wt-putty strength, motion, continue to wean, etc.   Fannie Knee, OTR/L, CHT 11/23/2022, 11:42 AM

## 2022-11-23 ENCOUNTER — Ambulatory Visit: Payer: Medicare HMO | Admitting: Rehabilitative and Restorative Service Providers"

## 2022-11-23 ENCOUNTER — Encounter: Payer: Self-pay | Admitting: Rehabilitative and Restorative Service Providers"

## 2022-11-23 DIAGNOSIS — M25532 Pain in left wrist: Secondary | ICD-10-CM

## 2022-11-23 DIAGNOSIS — M79642 Pain in left hand: Secondary | ICD-10-CM | POA: Diagnosis not present

## 2022-11-23 DIAGNOSIS — R600 Localized edema: Secondary | ICD-10-CM | POA: Diagnosis not present

## 2022-11-23 DIAGNOSIS — M25642 Stiffness of left hand, not elsewhere classified: Secondary | ICD-10-CM

## 2022-11-23 DIAGNOSIS — M6281 Muscle weakness (generalized): Secondary | ICD-10-CM

## 2022-11-23 DIAGNOSIS — R278 Other lack of coordination: Secondary | ICD-10-CM

## 2022-11-29 NOTE — Therapy (Signed)
OUTPATIENT OCCUPATIONAL THERAPY TREATMENT NOTE  Patient Name: Henessey Vanburen MRN: [id number] DOB:1950/07/01, 72 y.o., female Today's Date: 11/29/2022  PCP: Raliegh Ip, FNP REFERRING PROVIDER: Samuella Cota, MD   END OF SESSION:      Past Medical History:  Diagnosis Date   Arthritis    GERD (gastroesophageal reflux disease)    Hyperlipemia    Hypertension    PONV (postoperative nausea and vomiting)    last time 2005   Past Surgical History:  Procedure Laterality Date   BREAST SURGERY Bilateral    cyst removed   buniectomy Left 2005   Buninectomy Right 2013   CARPOMETACARPAL (CMC) FUSION OF THUMB Left 09/26/2022   Procedure: LEFT THUMB CMC ARTHROPLASTY WITH INTERNAL BRACE / MP CAPSULODESIS;  Surgeon: Samuella Cota, MD;  Location: [facility];  Service: Orthopedics;  Laterality: Left;   COLONOSCOPY     Patient Active Problem List   Diagnosis Date Noted   Chronic instability of first MCP joint, left 10/03/2022   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  Rationale for Evaluation and Treatment: Rehabilitation  PERTINENT HISTORY: Pre last MD visit 10/13/22: "X-rays were performed today show stable appearance of the thumb metacarpal status post trapeziectomy, pin is well secured at the MP joint. Sutures removed from the left thumb today at the capsulodesis. Pin will remain in for additional 2 weeks. Thumb spica cast reapplied today, follow-up in 2 weeks for transition to removable thumb spica brace,  pin removal and begin range of motion with therapy. "  She has just left her MD appointment where K-wire was removed. 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   PRECAUTIONS: Other: tree / coconut allergy   RED FLAGS: None   WEIGHT BEARING RESTRICTIONS: Yes NWB Lt hand now     SUBJECTIVE:   SUBJECTIVE STATEMENT:  She is now 9 weeks post Lt thumb CMC J arthroplasty.  She states ***      PAIN:  Are you having pain? Yes: NPRS scale:  *** 1-2/10 at rest now and no significant pain in past week  Pain location: Lt thumb base and wrist Pain description: sore, aching  Aggravating factors: movement  Relieving factors: rest   PATIENT GOALS: To improve the use of her left thumb and hand, return to daily activities safely    OBJECTIVE: (All objective assessments below are from initial evaluation on: 10/23/22 unless otherwise specified.)   HAND DOMINANCE: Right   ADLs: Overall ADLs: States decreased ability to grab, hold household objects, pain and difficulty to open containers, perform FMS tasks (manipulate fasteners on clothing), mild to moderate bathing problems as well.    FUNCTIONAL OUTCOME MEASURES: Eval: Quick DASH 47% impairment today  (Higher % Score  =  More Impairment)     UPPER EXTREMITY ROM     Shoulder to Wrist AROM LEFT eval Lt 11/16/22 Lt 11/30/22  Forearm supination 75 90   Forearm pronation  65 82  Wrist flexion 28 37 ***  Wrist extension 44 58 ***  Wrist ulnar deviation 16 30   Wrist radial deviation (-5) 9   Functional dart thrower's motion (F-DTM) in ulnar flexion 5 40   F-DTM in radial extension  24 38   (Blank rows = not tested)   Hand AROM LEFT eval Lt 11/16/22 Lt 11/23/22 Lt 11/30/22  Full Fist Ability (or Gap to Distal Palmar Crease) Loose and lacking 1cm to [facility] in composite      Thumb Opposition  (Kapandji Scale)  3/10 5/10     Thumb MCP  (0-60) 0-15 0 - 26 0 - 22 0 - ***  Thumb IP (0-80) 0-10 0 - 38 0 - 36 0 - ***  Thumb Radial Abduction Span      Thumb Palmar Abduction Span      (Blank rows = not tested)   UPPER EXTREMITY MMT:    Eval:  NT at eval due to recent and still healing injuries. Will be tested when appropriate.   MMT Right TBD  Shoulder flexion   Shoulder abduction   Shoulder adduction   Shoulder extension   Shoulder internal rotation   Shoulder external rotation   Middle trapezius   Lower trapezius   Elbow flexion   Elbow extension   Forearm supination   Forearm pronation   Wrist flexion   Wrist extension   Wrist ulnar deviation   Wrist radial deviation   (Blank rows = not tested)  HAND FUNCTION: 11/30/22: Grip strength  Left: *** lbs   11/16/22: Grip strength Right: 44.5 lbs, Left: 13 lbs  no significant pain  Eval: Observed weakness in affected Lt hand.    COORDINATION: Eval: Observed coordination impairments with affected Lt hand. 9 Hole Peg Test Left: TBD sec (TBD sec is WFL)   SENSATION: Eval:  Light touch intact today, though mildly hypersensitive around sx area    EDEMA:   Eval:  Mildly swollen in Lt hand and wrist today,  COGNITION: Eval: Overall cognitive status: WFL for evaluation today, though she has some guarding from pain behaviors and some overt fear and habits of stiffness in the left hand and arm  OBSERVATIONS:   Eval:  She underwent CMC J arthroplasty, partial trapezoid-ectomy, APL - FCR tendon transfer, and MP capsulodesis with K-wire pinning.  Surgical area is glued shut volar thumb area is well-healing.   TODAY'S TREATMENT:  11/30/22: *** Check ne wt-putty strength, motion, continue to wean, etc.    11/23/22: She starts with AROM for exercise as well as new measures which shows stiffness this cold morning, so she uses fluidotherapy for heat with concurrent AROM in tendon glides for activity to loosen up. OT does manual therapy IASTM and manual stretches for  review of HEP as well. She tolerates these well with AROM visually improving.   Next she is upgraded to t-putty strength at 8 weeks post-op and tolerates light yellow putty well, with now pain. She does the exercises listed below after demo and explanation, and does well with them. She was advised to do cautiously 2 x day.  She was also told to progressively keep weaning from orthosis up to 4-5 hours in the day, as long as well tolerated, but no weight lifting yet.   Exercises - Wrist Flexion Stretch  - 4 x daily - 3-5 reps - 15 sec hold - Wrist Prayer Stretch  - 4 x daily - 3-5 reps - 15 sec hold - Bend and Pull Back  Wrist SLOWLY  - 4-6 x daily - 15 reps - "Windshield Wipers"   - 4-6 x daily - 15 reps - Seated Composite Thumb Flexion PROM  - 2-3 x daily - 3-5 reps - 15 sec hold - Thumb Opposition  - 4-6 x daily - 10 reps - Full Fist  - 2-3 x daily - 5 reps - "Duck Mouth" Strength  - 2-3 x daily - 5 reps - Thumb Opposition with Putty  - 2-3 x daily - 5 reps   PATIENT EDUCATION: Education details: See tx section above for details  Person educated: Patient Education method: Verbal Instruction, Teach back, Handouts  Education comprehension: States and demonstrates understanding, Additional Education required    HOME EXERCISE PROGRAM: Access Code: [email or web address] URL: [email or web address] Date: 10/23/2022 Prepared by: Fannie Knee   GOALS: Goals reviewed with patient? Yes   SHORT TERM GOALS: (STG required if POC>30 days) Target Date: 11/10/22  Pt will obtain protective, custom orthotic. Goal status:  10/23/22 MET   2.  Pt will demo/state understanding of initial HEP to improve pain levels and prerequisite motion. Goal status: INITIAL   LONG TERM GOALS: Target Date: 12/08/22  Pt will improve functional ability by decreased impairment per Quick DASH assessment from 47% to 15% or better, for better quality of life. Goal status: INITIAL  2.  Pt will improve grip  strength in Lt hand from unsafe to test to at least 25lbs for functional use at home and in IADLs. Goal status: INITIAL  3.  Pt will improve A/ROM in Lt thumb MP/IP flex from 15* / 10* to at least 40* for each, to have functional motion for tasks like reach and grasp.  Goal status: INITIAL  4.  Pt will improve strength in Lt wrist flex/ext from unsafe and painful MMT to at least 4+/5 MMT to have increased functional ability to carry out selfcare and higher-level homecare tasks with less difficulty. Goal status: INITIAL  5.  Pt will improve coordination skills in Lt hand, as seen by [facility] score on 9HPT testing to have increased functional ability to carry out fine motor tasks (fasteners, etc.) and more complex, coordinated IADLs (meal prep, sports, etc.).  Goal status: INITIAL  6.  Pt will keep resting pain at <3 /10 to have good sleep and occupational participation in daily roles. Goal status: INITIAL   ASSESSMENT:  CLINICAL IMPRESSION: 11/30/22: ***  11/23/22: Doing well, tolerating stretches and new t-putty strength with no pain, cautiously.  Carry on   11/16/22: She had to miss at least 2 weeks of therapy due to having COVID virus, but fortunately she is on track and doing well not stiff or painful etc.  Carry on  Eval: Patient is a 72 y.o. female who was seen today for occupational therapy evaluation for residual deficits (pain, stiffness, weakness) following left thumb CMC arthroplasty and other accompanying procedures.  She will benefit from outpatient occupational therapy to increase functional ability and quality of life    PLAN:  OT FREQUENCY: 1-2x/week  OT DURATION: 6 weeks, through 12/08/22 for a total of 10 visits requested   PLANNED INTERVENTIONS: self care/ADL training, therapeutic exercise, therapeutic activity, neuromuscular re-education, manual therapy, scar mobilization, passive range of motion, splinting, ultrasound, fluidotherapy, compression bandaging, moist heat,  cryotherapy, contrast bath, patient/family education, energy conservation, coping strategies training, DME and/or AE instructions, Re-evaluation, and Dry needling  CONSULTED AND AGREED WITH PLAN OF CARE: Patient  PLAN FOR NEXT SESSION:  ***  Fannie Knee, OTR/L, CHT 11/29/2022,  2:46 PM

## 2022-11-30 ENCOUNTER — Ambulatory Visit: Payer: Medicare HMO | Admitting: Rehabilitative and Restorative Service Providers"

## 2022-11-30 ENCOUNTER — Encounter: Payer: Self-pay | Admitting: Rehabilitative and Restorative Service Providers"

## 2022-11-30 DIAGNOSIS — R600 Localized edema: Secondary | ICD-10-CM | POA: Diagnosis not present

## 2022-11-30 DIAGNOSIS — M25642 Stiffness of left hand, not elsewhere classified: Secondary | ICD-10-CM | POA: Diagnosis not present

## 2022-11-30 DIAGNOSIS — M6281 Muscle weakness (generalized): Secondary | ICD-10-CM

## 2022-11-30 DIAGNOSIS — M25532 Pain in left wrist: Secondary | ICD-10-CM

## 2022-11-30 DIAGNOSIS — R278 Other lack of coordination: Secondary | ICD-10-CM

## 2022-11-30 DIAGNOSIS — M79642 Pain in left hand: Secondary | ICD-10-CM | POA: Diagnosis not present

## 2022-12-07 ENCOUNTER — Encounter: Payer: Medicare HMO | Admitting: Rehabilitative and Restorative Service Providers"

## 2022-12-14 ENCOUNTER — Encounter: Payer: Medicare HMO | Admitting: Rehabilitative and Restorative Service Providers"

## 2023-12-21 ENCOUNTER — Other Ambulatory Visit (INDEPENDENT_AMBULATORY_CARE_PROVIDER_SITE_OTHER): Payer: Self-pay

## 2023-12-21 ENCOUNTER — Ambulatory Visit: Admitting: Orthopaedic Surgery

## 2023-12-21 VITALS — Ht 65.0 in | Wt 180.0 lb

## 2023-12-21 DIAGNOSIS — M25551 Pain in right hip: Secondary | ICD-10-CM

## 2023-12-21 NOTE — Progress Notes (Signed)
 Office Visit Note   Patient: Annette Jefferson           Date of Birth: 1950/04/07           MRN: [id number] Visit Date: 12/21/2023              Requested by: Joan Laneta HERO, FNP [id number] [place],  [place] [address] PCP: Stroud, Natalie M, FNP   Assessment & Plan: Visit Diagnoses:  1. Pain of right hip     Plan: History of Present Illness Annette Jefferson is a 73 year old female who presents with right hip pain.  She experiences localized pain in the right hip, specifically in the upper buttock area, which does not radiate to the side of the hip, groin, or thigh. The pain is absent during walking but becomes severe, described as 'like a toothache,' after sitting for a period or lying down at night.  She has undergone back surgery with hardware placement, which previously improved her condition. She was advised that the pain might be muscular and attended physical therapy, learning exercises such as pelvic lifts to manage the pain. Despite these efforts, the pain persists.  Regular walking with a friend does not aggravate the pain, but discomfort returns after periods of inactivity. No pain is present in the groin or thigh.  Physical Exam MUSCULOSKELETAL: FABER maneuver elicits pain in the upper buttock area. Straight leg raise elicits pain in the hamstring area. Resisted leg raise elicits pain in the hip.  No trochanteric tenderness.  Assessment and Plan Right sacroiliac joint pain possible referred pain from lumbar spine Chronic right sacroiliac joint pain likely from the sacroiliac joint.  Previous physical therapy provided limited relief. - referred to Dr. Burnetta for steroid injection into the right sacroiliac joint.  Follow-Up Instructions: No follow-ups on file.   Orders:  Orders Placed This Encounter  Procedures   XR HIP UNILAT W OR W/O PELVIS 1V RIGHT   No orders of the defined types were placed in this encounter.     Procedures: No procedures  performed   Clinical Data: No additional findings.   Subjective: Chief Complaint  Patient presents with   Right Hip - Pain    HPI  Review of Systems  Constitutional: Negative.   HENT: Negative.    Eyes: Negative.   Respiratory: Negative.    Cardiovascular: Negative.   Endocrine: Negative.   Musculoskeletal: Negative.   Neurological: Negative.   Hematological: Negative.   Psychiatric/Behavioral: Negative.    All other systems reviewed and are negative.    Objective: Vital Signs: Ht 5' 5 (1.651 m)   Wt 180 lb (81.6 kg)   BMI 29.95 kg/m   Physical Exam Vitals and nursing note reviewed.  Constitutional:      Appearance: She is well-developed.  HENT:     Head: Atraumatic.     Nose: Nose normal.  Eyes:     Extraocular Movements: Extraocular movements intact.  Cardiovascular:     Pulses: Normal pulses.  Pulmonary:     Effort: Pulmonary effort is normal.  Abdominal:     Palpations: Abdomen is soft.  Musculoskeletal:     Cervical back: Neck supple.  Skin:    General: Skin is warm.     Capillary Refill: Capillary refill takes less than 2 seconds.  Neurological:     Mental Status: She is alert. Mental status is at baseline.  Psychiatric:        Behavior: Behavior normal.  Thought Content: Thought content normal.        Judgment: Judgment normal.     Ortho Exam  Specialty Comments:  Narrative & Impression CLINICAL DATA:  Right-sided low back pain for 3-4 weeks. No known injury.   EXAM: MRI LUMBAR SPINE WITHOUT AND WITH CONTRAST   TECHNIQUE: Multiplanar and multiecho pulse sequences of the lumbar spine were obtained without and with intravenous contrast.   CONTRAST:  18mL MULTIHANCE  GADOBENATE DIMEGLUMINE  529 MG/ML IV SOLN   COMPARISON:  None.   FINDINGS: Segmentation:  Standard.   Alignment:  Physiologic.   Vertebrae:  No fracture, evidence of discitis, or bone lesion.   Conus medullaris and cauda equina: Conus extends to the T12  level. Conus and cauda equina appear normal.   Paraspinal and other soft tissues: No acute paraspinal abnormality.   Disc levels:   Disc spaces: Degenerative disc disease with disc desiccation and mild disc height loss at L2-3 and L3-4. Posterior lumbar interbody fusion at L4-5.   T11-12: Small left paracentral disc protrusion.   T12-L1: No significant disc bulge. No evidence of neural foraminal stenosis. No central canal stenosis.   L1-L2: Minimal broad-based disc bulge. No evidence of neural foraminal stenosis. No central canal stenosis.   L2-L3: Mild broad-based disc bulge. No evidence of neural foraminal stenosis. No central canal stenosis.   L3-L4: Mild broad-based disc bulge. Mild bilateral foraminal stenosis. Mild spinal stenosis.   L4-L5: Interbody fusion. No evidence of neural foraminal stenosis. No central canal stenosis.   L5-S1: No significant disc bulge. No evidence of neural foraminal stenosis. No central canal stenosis. Moderate right facet arthropathy.   IMPRESSION: 1. Mild lumbar spine spondylosis as described above. 2. Posterior lumbar interbody fusion at L4-5 without failure or complication.     Electronically Signed   By: Julaine Blanch   On: 06/11/2017 12:24  Imaging: XR HIP UNILAT W OR W/O PELVIS 1V RIGHT Result Date: 12/21/2023 Xrays show degenerative changes of the right SI joint.  Presence of hardware in the spine from prior fusion. Hip joint spaces are preserved.    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  Past Surgical History:  Procedure Laterality Date   BREAST SURGERY Bilateral    cyst removed   buniectomy Left 2005   Buninectomy Right 2013   CARPOMETACARPAL (CMC) FUSION OF THUMB Left 09/26/2022   Procedure: LEFT THUMB CMC ARTHROPLASTY WITH INTERNAL BRACE / MP CAPSULODESIS;  Surgeon: Arlinda Buster, MD;  Location: [facility];  Service: Orthopedics;  Laterality: Left;   COLONOSCOPY     Social History   Occupational History   Not on file  Tobacco Use   Smoking status: Never    Passive exposure: Never   Smokeless tobacco: Never  Substance and Sexual Activity   Alcohol use: No   Drug use: No  Sexual activity: Not Currently    Birth control/protection: Post-menopausal

## 2023-12-24 ENCOUNTER — Other Ambulatory Visit: Payer: Self-pay

## 2023-12-24 ENCOUNTER — Encounter: Payer: Self-pay | Admitting: Sports Medicine

## 2023-12-24 ENCOUNTER — Ambulatory Visit: Admitting: Sports Medicine

## 2023-12-24 DIAGNOSIS — M7918 Myalgia, other site: Secondary | ICD-10-CM

## 2023-12-24 DIAGNOSIS — M461 Sacroiliitis, not elsewhere classified: Secondary | ICD-10-CM

## 2023-12-24 DIAGNOSIS — G8929 Other chronic pain: Secondary | ICD-10-CM | POA: Diagnosis not present

## 2023-12-24 DIAGNOSIS — M533 Sacrococcygeal disorders, not elsewhere classified: Secondary | ICD-10-CM

## 2023-12-24 NOTE — Progress Notes (Signed)
 Annette Jefferson - 73 y.o. female MRN [id number]  Date of birth: 10-13-1950  Office Visit Note: Visit Date: 12/24/2023 PCP: Joan Laneta HERO, FNP Referred by: Stroud, Natalie M, FNP  Subjective: Chief Complaint  Patient presents with   Lower Back - Pain   HPI: Annette Jefferson is a pleasant 73 y.o. female who presents today for acute on chronic low back and right-sided SI-joint/buttock pain.  Annette Jefferson has been having right posterior hip pain, and the back part of the low back.  This does radiate into the buttock.  She denies any numbness or tingling.  This is interfering with ADLs as well as interfering with her sleep and being able to lay down on that side.  The pain has persisted despite formalized physical therapy and home exercises.  She has had previous lumbar surgery with Dr. Barbarann in the past.  Pertinent ROS were reviewed with the patient and found to be negative unless otherwise specified above in HPI.   Assessment & Plan: Visit Diagnoses:  1. Chronic right SI joint pain   2. Right buttock pain   3. SI joint arthritis    Plan: Impression is acute on chronic right posterior hip pain with pain emanating from the SI joint as well as x-rays findings of sacroiliac arthritic change bilaterally.  Given her previous lumbar spinal fusion, likely as transitional stresses on the SI joint region.  Through shared decision making, we did proceed with ultrasound-guided right SI joint injection, patient tolerated well.  Advised on postinjection protocol.  She may use ice/heat as well as Tylenol  or short course of meloxicam  15 mg daily.  Following 48-72 hours of modified rest/activity, she may return to some of her home exercises she was shown at PT, working on mobility and stretching of the SI joint and pelvis.  She will follow-up with Dr. Jerri if for some reason she is not significantly improved.  I am happy to see her back as needed.  Follow-up: Return if symptoms worsen or fail to improve.    Meds & Orders: No orders of the defined types were placed in this encounter.   Orders Placed This Encounter  Procedures   US  Guided Needle Placement - No Linked Charges     Procedures: U/S-guided SI-joint injection, Right   After discussion of risk/benefits/indications, informed verbal consent was obtained. A timeout was then performed. The patient was positioned in a prone position on exam room table with a pillow placed under the pelvis for mild hip flexion. The SI joint area was cleaned and prepped with betadine and alcohol swabs. Sterile ultrasound gel was applied and the ultrasound transducer was placed in an anatomic axial plane over the PSIS, then moved distally over the SI-joint. Using ultrasound guidance, a 22-gauge, 3.5 needle was inserted from a medial to lateral approach utilizing an in-plane approach and directed into the SI-joint. The SI-joint was then injected with a mixture of 4:2 lidocaine :depomedrol with visualization of the injectate flow into the SI-joint under ultrasound visualization. The patient tolerated the procedure well without immediate complications.       Clinical History: Narrative & Impression CLINICAL DATA:  Right-sided low back pain for 3-4 weeks. No known injury.   EXAM: MRI LUMBAR SPINE WITHOUT AND WITH CONTRAST   TECHNIQUE: Multiplanar and multiecho pulse sequences of the lumbar spine were obtained without and with intravenous contrast.   CONTRAST:  18mL MULTIHANCE  GADOBENATE DIMEGLUMINE  529 MG/ML IV SOLN   COMPARISON:  None.   FINDINGS: Segmentation:  Standard.  Alignment:  Physiologic.   Vertebrae:  No fracture, evidence of discitis, or bone lesion.   Conus medullaris and cauda equina: Conus extends to the T12 level. Conus and cauda equina appear normal.   Paraspinal and other soft tissues: No acute paraspinal abnormality.   Disc levels:   Disc spaces: Degenerative disc disease with disc desiccation and mild disc height loss at  L2-3 and L3-4. Posterior lumbar interbody fusion at L4-5.   T11-12: Small left paracentral disc protrusion.   T12-L1: No significant disc bulge. No evidence of neural foraminal stenosis. No central canal stenosis.   L1-L2: Minimal broad-based disc bulge. No evidence of neural foraminal stenosis. No central canal stenosis.   L2-L3: Mild broad-based disc bulge. No evidence of neural foraminal stenosis. No central canal stenosis.   L3-L4: Mild broad-based disc bulge. Mild bilateral foraminal stenosis. Mild spinal stenosis.   L4-L5: Interbody fusion. No evidence of neural foraminal stenosis. No central canal stenosis.   L5-S1: No significant disc bulge. No evidence of neural foraminal stenosis. No central canal stenosis. Moderate right facet arthropathy.   IMPRESSION: 1. Mild lumbar spine spondylosis as described above. 2. Posterior lumbar interbody fusion at L4-5 without failure or complication.     Electronically Signed   By: Julaine Blanch   On: 06/11/2017 12:24  She reports that she has never smoked. She has never been exposed to tobacco smoke. She has never used smokeless tobacco. No results for input(s): HGBA1C, LABURIC in the last 8760 hours.  Objective:    Physical Exam  Gen: Well-appearing, in no acute distress; non-toxic CV: Well-perfused. Warm.  Resp: Breathing unlabored on room air; no wheezing. Psych: Fluid speech in conversation; appropriate affect; normal thought process  Ortho Exam - SI joint/Low back: + TTP over the right SI joint, just inferior to the PSIS.  Positive Fortin's point test.  Positive SI joint compression test.  There is a well-healed vertical incision from previous lumbar surgery, limited extension at this location.  Imaging:  2-views of the right hip from 12/21/23 independently reviewed by myself today.   Xrays show degenerative changes of the left and right SI joint.  Presence of  hardware in the spine from prior fusion. Hip joint  spaces are preserved.   Past Medical/Family/Surgical/Social History: Medications & Allergies reviewed per EMR, new medications updated. 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 No pertinent family history. Past Surgical History:  Procedure Laterality Date   BREAST SURGERY Bilateral    cyst removed   buniectomy Left 2005   Buninectomy Right 2013   CARPOMETACARPAL (CMC) FUSION OF THUMB Left 09/26/2022   Procedure: LEFT THUMB CMC ARTHROPLASTY WITH INTERNAL BRACE / MP CAPSULODESIS;  Surgeon: Arlinda Buster, MD;  [facility];  Service: Orthopedics;  Laterality: Left;   COLONOSCOPY     Social History   Occupational History   Not on  file  Tobacco Use   Smoking status: Never    Passive exposure: Never   Smokeless tobacco: Never  Substance and Sexual Activity   Alcohol use: No   Drug use: No   Sexual activity: Not Currently    Birth control/protection: Post-menopausal
